# Patient Record
Sex: Male | Born: 1985 | Race: White | Hispanic: No | Marital: Single | Smoking: Current some day smoker
Health system: Southern US, Community
[De-identification: ages and names within clinical notes are randomized; demographics above are authoritative.]

## PROBLEM LIST (undated history)

## (undated) DIAGNOSIS — F988 Other specified behavioral and emotional disorders with onset usually occurring in childhood and adolescence: Secondary | ICD-10-CM

## (undated) DIAGNOSIS — D689 Coagulation defect, unspecified: Secondary | ICD-10-CM

## (undated) DIAGNOSIS — Z87820 Personal history of traumatic brain injury: Secondary | ICD-10-CM

## (undated) DIAGNOSIS — S069X9A Unspecified intracranial injury with loss of consciousness of unspecified duration, initial encounter: Secondary | ICD-10-CM

## (undated) DIAGNOSIS — F909 Attention-deficit hyperactivity disorder, unspecified type: Secondary | ICD-10-CM

## (undated) HISTORY — DX: Other specified behavioral and emotional disorders with onset usually occurring in childhood and adolescence: F98.8

## (undated) HISTORY — PX: OTHER SURGICAL HISTORY: SHX169

## (undated) HISTORY — DX: Unspecified intracranial injury with loss of consciousness of unspecified duration, initial encounter: S06.9X9A

## (undated) HISTORY — DX: Personal history of traumatic brain injury: Z87.820

## (undated) HISTORY — DX: Attention-deficit hyperactivity disorder, unspecified type: F90.9

## (undated) HISTORY — DX: Coagulation defect, unspecified: D68.9

---
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
## 2003-03-28 ENCOUNTER — Encounter
Admission: RE | Admit: 2003-03-28 | Discharge: 2003-06-26 | Payer: Self-pay | Admitting: Physical Medicine and Rehabilitation

## 2003-06-27 ENCOUNTER — Encounter
Admission: RE | Admit: 2003-06-27 | Discharge: 2003-09-25 | Payer: Self-pay | Admitting: Physical Medicine and Rehabilitation

## 2003-09-26 ENCOUNTER — Encounter
Admission: RE | Admit: 2003-09-26 | Discharge: 2003-12-25 | Payer: Self-pay | Admitting: Physical Medicine and Rehabilitation

## 2003-11-08 ENCOUNTER — Encounter: Admission: RE | Admit: 2003-11-08 | Discharge: 2003-11-08 | Payer: Self-pay | Admitting: Psychology

## 2003-12-26 ENCOUNTER — Encounter
Admission: RE | Admit: 2003-12-26 | Discharge: 2004-03-25 | Payer: Self-pay | Admitting: Physical Medicine and Rehabilitation

## 2004-03-11 ENCOUNTER — Encounter
Admission: RE | Admit: 2004-03-11 | Discharge: 2004-06-09 | Payer: Self-pay | Admitting: Physical Medicine and Rehabilitation

## 2004-07-16 ENCOUNTER — Encounter
Admission: RE | Admit: 2004-07-16 | Discharge: 2004-10-14 | Payer: Self-pay | Admitting: Physical Medicine and Rehabilitation

## 2005-06-13 ENCOUNTER — Emergency Department (HOSPITAL_COMMUNITY): Admission: EM | Admit: 2005-06-13 | Discharge: 2005-06-13 | Payer: Self-pay | Admitting: Emergency Medicine

## 2005-09-14 ENCOUNTER — Encounter
Admission: RE | Admit: 2005-09-14 | Discharge: 2005-12-13 | Payer: Self-pay | Admitting: Physical Medicine and Rehabilitation

## 2006-02-09 DIAGNOSIS — D689 Coagulation defect, unspecified: Secondary | ICD-10-CM

## 2006-02-09 HISTORY — DX: Coagulation defect, unspecified: D68.9

## 2006-05-14 ENCOUNTER — Ambulatory Visit: Payer: Self-pay | Admitting: Vascular Surgery

## 2006-05-16 ENCOUNTER — Encounter: Payer: Self-pay | Admitting: Vascular Surgery

## 2006-05-16 ENCOUNTER — Inpatient Hospital Stay (HOSPITAL_COMMUNITY): Admission: EM | Admit: 2006-05-16 | Discharge: 2006-05-21 | Payer: Self-pay | Admitting: Emergency Medicine

## 2006-05-16 ENCOUNTER — Ambulatory Visit: Payer: Self-pay | Admitting: Vascular Surgery

## 2008-02-10 HISTORY — PX: FOOT SURGERY: SHX648

## 2008-05-02 ENCOUNTER — Encounter: Admission: RE | Admit: 2008-05-02 | Discharge: 2008-05-03 | Payer: Self-pay | Admitting: Orthopedic Surgery

## 2008-05-25 ENCOUNTER — Ambulatory Visit: Payer: Self-pay | Admitting: Family Medicine

## 2008-05-25 DIAGNOSIS — F988 Other specified behavioral and emotional disorders with onset usually occurring in childhood and adolescence: Secondary | ICD-10-CM | POA: Insufficient documentation

## 2008-05-25 HISTORY — DX: Other specified behavioral and emotional disorders with onset usually occurring in childhood and adolescence: F98.8

## 2008-08-28 ENCOUNTER — Telehealth: Payer: Self-pay | Admitting: Family Medicine

## 2008-11-21 ENCOUNTER — Telehealth: Payer: Self-pay | Admitting: Family Medicine

## 2008-12-03 ENCOUNTER — Ambulatory Visit: Payer: Self-pay | Admitting: Family Medicine

## 2008-12-03 DIAGNOSIS — R635 Abnormal weight gain: Secondary | ICD-10-CM | POA: Insufficient documentation

## 2009-03-06 ENCOUNTER — Telehealth: Payer: Self-pay | Admitting: Family Medicine

## 2009-05-15 ENCOUNTER — Ambulatory Visit: Payer: Self-pay | Admitting: Family Medicine

## 2009-05-15 DIAGNOSIS — Z87820 Personal history of traumatic brain injury: Secondary | ICD-10-CM

## 2009-05-15 HISTORY — DX: Personal history of traumatic brain injury: Z87.820

## 2009-05-27 ENCOUNTER — Telehealth: Payer: Self-pay | Admitting: Family Medicine

## 2009-06-04 ENCOUNTER — Telehealth: Payer: Self-pay | Admitting: Family Medicine

## 2009-09-02 ENCOUNTER — Telehealth: Payer: Self-pay | Admitting: Family Medicine

## 2009-12-02 ENCOUNTER — Telehealth: Payer: Self-pay | Admitting: Family Medicine

## 2010-03-03 ENCOUNTER — Telehealth: Payer: Self-pay | Admitting: Family Medicine

## 2010-03-11 NOTE — Progress Notes (Signed)
Summary: Ritalin refill X 3 months  Phone Note Call from Patient   Caller: Dad-tom Call For: [phone] Summary of Call: dad is calling requesting pt ritalin la 30  mg xr please call when ready Initial call taken by: Heron Sabins,  March 06, 2009 11:29 AM  Follow-up for Phone Call        written. Follow-up by: Evelena Peat MD,  March 06, 2009 12:19 PM  Additional Follow-up for Phone Call Additional follow up Details #1::        Father informed Rx ready for pick-up Additional Follow-up by: Sid Falcon LPN,  March 06, 2009 1:33 PM    Prescriptions: RITALIN LA 30 MG XR24H-CAP (METHYLPHENIDATE HCL) once daily May fill in two months  #30 x 0   Entered and Authorized by:   Evelena Peat MD   Signed by:   Evelena Peat MD on 03/06/2009   Method used:   Print then Give to Patient   RxID:   [id number] RITALIN LA 30 MG XR24H-CAP (METHYLPHENIDATE HCL) once daily May fill in one month  #30 x 0   Entered and Authorized by:   Evelena Peat MD   Signed by:   Evelena Peat MD on 03/06/2009   Method used:   Print then Give to Patient   RxID:   [id number] RITALIN LA 30 MG XR24H-CAP (METHYLPHENIDATE HCL) once daily  #30 x 0   Entered and Authorized by:   Evelena Peat MD   Signed by:   Evelena Peat MD on 03/06/2009   Method used:   Print then Give to Patient   RxID:   [id number]

## 2010-03-11 NOTE — Progress Notes (Signed)
Summary: Pt req script for Ritalin 30mg  xr  Phone Note Refill Request Call back at Home Phone [phone] Message from:  Patient on September 02, 2009 1:26 PM  Refills Requested: Medication #1:  RITALIN LA 30 MG XR24H-CAP once daily   Dosage confirmed as above?Dosage Confirmed  Medication #2:  RITALIN LA 30 MG XR24H-CAP once daily May fill in one month  Medication #3:  RITALIN LA 30 MG XR24H-CAP once daily May fill in two months. Last Filled: 06/05/09   Last office visit 05/16/09   Method Requested: Pick up at Office Initial call taken by: Lucy Antigua,  September 02, 2009 1:26 PM  Follow-up for Phone Call        Last filled 4/16 [facility] LPN  September 02, 2009 4:34 PM will refill. Follow-up by: Evelena Peat MD,  September 02, 2009 6:02 PM  Additional Follow-up for Phone Call Additional follow up Details #1::        Pt informed on VM ready for pick-up Additional Follow-up by: Sid Falcon LPN,  September 03, 2009 8:25 AM    Prescriptions: RITALIN LA 30 MG XR24H-CAP (METHYLPHENIDATE HCL) once daily May fill in two months  #30 x 0   Entered and Authorized by:   Evelena Peat MD   Signed by:   Evelena Peat MD on 09/02/2009   Method used:   Print then Give to Patient   RxID:   [id number] RITALIN LA 30 MG XR24H-CAP (METHYLPHENIDATE HCL) once daily May fill in one month  #30 x 0   Entered and Authorized by:   Evelena Peat MD   Signed by:   Evelena Peat MD on 09/02/2009   Method used:   Print then Give to Patient   RxID:   [id number] RITALIN LA 30 MG XR24H-CAP (METHYLPHENIDATE HCL) once daily  #30 x 0   Entered and Authorized by:   Evelena Peat MD   Signed by:   Evelena Peat MD on 09/02/2009   Method used:   Print then Give to Patient   RxID:   [id number]

## 2010-03-11 NOTE — Progress Notes (Signed)
Summary: Pt req script for Ritalin LA 30mg   Phone Note Call from Patient Call back at [facility] Phone [phone]   Caller: Patient Summary of Call: Pt called req script Ritalin LA 30mg . Pls call when ready.  Initial call taken by: Lucy Antigua,  June 04, 2009 1:16 PM  Follow-up for Phone Call        refilled Follow-up by: Evelena Peat MD,  June 05, 2009 7:43 AM    Prescriptions: RITALIN LA 30 MG XR24H-CAP (METHYLPHENIDATE HCL) once daily May fill in two months  #30 x 0   Entered and Authorized by:   Evelena Peat MD   Signed by:   Evelena Peat MD on 06/05/2009   Method used:   Print then Give to Patient   RxID:   [id number] RITALIN LA 30 MG XR24H-CAP (METHYLPHENIDATE HCL) once daily May fill in one month  #30 x 0   Entered and Authorized by:   Evelena Peat MD   Signed by:   Evelena Peat MD on 06/05/2009   Method used:   Print then Give to Patient   RxID:   [id number] RITALIN LA 30 MG XR24H-CAP (METHYLPHENIDATE HCL) once daily  #30 x 0   Entered and Authorized by:   Evelena Peat MD   Signed by:   Evelena Peat MD on 06/05/2009   Method used:   Print then Give to Patient   RxID:   [id number]  Pt notified.

## 2010-03-11 NOTE — Progress Notes (Signed)
Summary: Pt req script for Ritalin LA 30mg , last filled 7/25  Phone Note Refill Request Call back at [phone] cell Message from:  Patient on December 02, 2009 11:12 AM  Refills Requested: Medication #1:  RITALIN LA 30 MG XR24H-CAP once daily  Medication #2:  RITALIN LA 30 MG XR24H-CAP once daily May fill in one month  Medication #3:  RITALIN LA 30 MG XR24H-CAP once daily May fill in two months.  Method Requested: Pick up at Office Initial call taken by: Lucy Antigua,  December 02, 2009 11:12 AM  Follow-up for Phone Call        Last filled 7/25 Sid Falcon LPN  December 02, 2009 12:09 PM will refill Follow-up by: Evelena Peat MD,  December 03, 2009 8:39 AM  Additional Follow-up for Phone Call Additional follow up Details #1::        Pt informed ready for pick-up Additional Follow-up by: Sid Falcon LPN,  December 03, 2009 9:33 AM    Prescriptions: RITALIN LA 30 MG XR24H-CAP (METHYLPHENIDATE HCL) once daily May fill in two months  #30 x 0   Entered and Authorized by:   Evelena Peat MD   Signed by:   Evelena Peat MD on 12/03/2009   Method used:   Print then Give to Patient   RxID:   [id number] RITALIN LA 30 MG XR24H-CAP (METHYLPHENIDATE HCL) once daily May fill in one month  #30 x 0   Entered and Authorized by:   Evelena Peat MD   Signed by:   Evelena Peat MD on 12/03/2009   Method used:   Print then Give to Patient   RxID:   [id number] RITALIN LA 30 MG XR24H-CAP (METHYLPHENIDATE HCL) once daily  #30 x 0   Entered and Authorized by:   Evelena Peat MD   Signed by:   Evelena Peat MD on 12/03/2009   Method used:   Print then Give to Patient   RxID:   [id number]

## 2010-03-11 NOTE — Letter (Signed)
Summary: Medical Examination for [facility]  Medical Examination for DMV   Imported By: Maryln Gottron 05/17/2009 12:23:29  _____________________________________________________________________  External Attachment:    Type:   Image     Comment:   External Document

## 2010-03-11 NOTE — Progress Notes (Signed)
Summary: forms mailed?  Phone Note Call from Patient   Caller: mother,[phone] Summary of Call: Came in 4-6 for forms to be mailed to [facility].  Checking to be sure they were mailed. Initial call taken by: Rudy Jew, RN,  May 27, 2009 3:51 PM  Follow-up for Phone Call        YES.  We also made copies to prove that they were done. Follow-up by: Evelena Peat MD,  May 27, 2009 4:01 PM  Additional Follow-up for Phone Call Additional follow up Details #1::        Phone Call Completed Additional Follow-up by: Rudy Jew, RN,  May 27, 2009 4:24 PM
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
## 2010-03-13 NOTE — Progress Notes (Signed)
Summary: refill Ritalin, last filled 12/03/09  Phone Note Refill Request Call back at [phone] Message from:  Patient---live call  Refills Requested: Medication #1:  RITALIN LA 30 MG XR24H-CAP once daily call pt when ready.  Initial call taken by: Warnell Forester,  March 03, 2010 2:17 PM  Follow-up for Phone Call        Last filled 12/03/2009 Sid Falcon LPN  March 03, 2010 2:23 PM refill Schedule office follow up. Follow-up by: Evelena Peat MD,  March 05, 2010 8:26 AM  Additional Follow-up for Phone Call Additional follow up Details #1::        Pt informed he needs follow-up visit, Rx ready for pick-up Additional Follow-up by: Sid Falcon LPN,  March 05, 2010 2:09 PM    Prescriptions: RITALIN LA 30 MG XR24H-CAP (METHYLPHENIDATE HCL) once daily May fill in two months  #30 x 0   Entered by:   Sid Falcon LPN   Authorized by:   Evelena Peat MD   Signed by:   Sid Falcon LPN on 11/91/4782   Method used:   Print then Give to Patient   RxID:   [id number] RITALIN LA 30 MG XR24H-CAP (METHYLPHENIDATE HCL) once daily May fill in one month  #30 x 0   Entered by:   Sid Falcon LPN   Authorized by:   Evelena Peat MD   Signed by:   Sid Falcon LPN on 29/52/8413   Method used:   Print then Give to Patient   RxID:   [id number] RITALIN LA 30 MG XR24H-CAP (METHYLPHENIDATE HCL) once daily  #30 x 0   Entered by:   Sid Falcon LPN   Authorized by:   Evelena Peat MD   Signed by:   Sid Falcon LPN on 34/74/2595   Method used:   Print then Give to Patient   RxID:   [id number] RITALIN LA 30 MG XR24H-CAP (METHYLPHENIDATE HCL) once daily May fill in two months  #30 x 0   Entered and Authorized by:   Evelena Peat MD   Signed by:   Evelena Peat MD on 03/05/2010   Method used:   Print then Give to Patient   RxID:   [id number] RITALIN LA 30 MG XR24H-CAP (METHYLPHENIDATE HCL) once daily May fill in one month  #30 x 0   Entered  and Authorized by:   Evelena Peat MD   Signed by:   Evelena Peat MD on 03/05/2010   Method used:   Print then Give to Patient   RxID:   [id number] RITALIN LA 30 MG XR24H-CAP (METHYLPHENIDATE HCL) once daily  #30 x 0   Entered and Authorized by:   Evelena Peat MD   Signed by:   Evelena Peat MD on 03/05/2010   Method used:   Print then Give to Patient   RxID:   [id number]

## 2010-03-25 ENCOUNTER — Encounter: Payer: Self-pay | Admitting: Family Medicine

## 2010-03-27 ENCOUNTER — Ambulatory Visit: Payer: Self-pay | Admitting: Family Medicine

## 2010-03-28 ENCOUNTER — Ambulatory Visit (INDEPENDENT_AMBULATORY_CARE_PROVIDER_SITE_OTHER): Payer: BC Managed Care – PPO | Admitting: Family Medicine

## 2010-03-28 ENCOUNTER — Encounter: Payer: Self-pay | Admitting: Family Medicine

## 2010-03-28 VITALS — BP 110/72 | Temp 98.6°F | Ht 67.0 in | Wt 219.0 lb

## 2010-03-28 DIAGNOSIS — F988 Other specified behavioral and emotional disorders with onset usually occurring in childhood and adolescence: Secondary | ICD-10-CM

## 2010-03-28 NOTE — Progress Notes (Signed)
  Subjective:    Patient ID: Joseph Pittman, male    DOB: 1986-02-03, 25 y.o.   MRN: [id number]  HPI  patient is seen for followup attention deficit disorder. He has history of traumatic brain injury. Not doing any regular exercise. Remains on Ritalin long acting 30 mg daily. No headaches or other side effects. Recent refills given. Patient has had some weight gain since last year. Approximately 15 pounds. Able to focus with medication.   Review of Systems  denies any headache, appetite change, chest pain, dizziness, or palpitations. Positive for weight gain    Objective:   Physical Exam  patient is alert and in no distress.   oropharynx is clear Eardrums normal Neck supple no mass Chest good auscultation Heart regular rhythm and rate  left hemiparesis from previous traumatic brain injury       Assessment & Plan:   #1 attention deficit disorder  #2 history of traumatic brain injury #3 obesity with recent weight gain      recommend patient work on weight loss. Recommend complete physical later this year.

## 2010-03-28 NOTE — Patient Instructions (Addendum)
Work on weight loss. More consistent exercise. Consider physical at some point later this year.

## 2010-05-12 ENCOUNTER — Encounter: Payer: Self-pay | Admitting: Family Medicine

## 2010-05-12 ENCOUNTER — Ambulatory Visit (INDEPENDENT_AMBULATORY_CARE_PROVIDER_SITE_OTHER): Payer: BC Managed Care – PPO | Admitting: Family Medicine

## 2010-05-12 VITALS — BP 120/82 | Temp 98.4°F

## 2010-05-12 DIAGNOSIS — J309 Allergic rhinitis, unspecified: Secondary | ICD-10-CM

## 2010-05-12 DIAGNOSIS — J040 Acute laryngitis: Secondary | ICD-10-CM

## 2010-05-12 MED ORDER — FLUTICASONE PROPIONATE 50 MCG/ACT NA SUSP: 2.0000 | Freq: Every day | NASAL | Status: AC

## 2010-05-12 NOTE — Patient Instructions (Signed)
Try over the counter Allegra one daily for allergy symptoms.

## 2010-05-12 NOTE — Progress Notes (Signed)
  Subjective:    Patient ID: Joseph Pittman, male    DOB: 1985-03-24, 25 y.o.   MRN: [id number]  HPI Patient seen with laryngitis type symptoms. Onset 3-4 days ago. Occasional dry cough. No fever. Postnasal drip symptoms. History of seasonal allergies. Taking Benadryl with minimal relief. No purulent secretions.   Review of Systems  Constitutional: Negative for fever.  HENT: Positive for congestion, rhinorrhea and postnasal drip. Negative for sore throat, neck pain and neck stiffness.   Respiratory: Positive for cough. Negative for shortness of breath.   Cardiovascular: Negative for chest pain.  Skin: Negative for rash.       Objective:   Physical Exam  Constitutional: He appears well-developed and well-nourished.  HENT:  Head: Normocephalic and atraumatic.  Right Ear: External ear normal.  Left Ear: External ear normal.  Mouth/Throat: Oropharynx is clear and moist. No oropharyngeal exudate.  Neck: Neck supple. No thyromegaly present.  Cardiovascular: Normal rate, regular rhythm and normal heart sounds.   Pulmonary/Chest: Effort normal and breath sounds normal. He has no wheezes. He has no rales.  Lymphadenopathy:    He has no cervical adenopathy.          Assessment & Plan:  Acute laryngitis probably related to allergic postnasal drip symptoms. Start Flonase 2 sprays per nostril once daily and Allegra one daily

## 2010-06-04 ENCOUNTER — Telehealth: Payer: Self-pay | Admitting: Family Medicine

## 2010-06-04 MED ORDER — METHYLPHENIDATE HCL ER (LA) 30 MG PO CP24: 30.0000 mg | ORAL_CAPSULE | ORAL | Status: DC

## 2010-06-04 NOTE — Telephone Encounter (Signed)
Pt called req script for Ritalin 30 mg LA. 3 scripts

## 2010-06-04 NOTE — Telephone Encounter (Signed)
Last filled 03/05/10.  I printed, signed, pt informed ready for pick-up

## 2010-06-27 NOTE — Discharge Summary (Signed)
NAME:  Joseph Pittman, Joseph Pittman              ACCOUNT NO.:  [id number]   MEDICAL RECORD NO.:  [id number]          PATIENT TYPE:  INP   LOCATION:  5730                         FACILITY:  [facility]   PHYSICIAN:  Mobolaji B. Bakare, M.D.DATE OF BIRTH:  Sep 03, 1985   DATE OF ADMISSION:  05/16/2006  DATE OF DISCHARGE:  05/21/2006                               DISCHARGE SUMMARY   PRIMARY CARE PHYSICIAN:  Dr. Evelena Peat.   FINAL DIAGNOSIS:  Right lower extremity deep venous thrombosis.   SECONDARY DIAGNOSIS:  History of head trauma.   PROCEDURES:  1. Chest x-ray shows low lung volumes with bibasilar atelectasis.  2. Lower extremity Doppler showed extensive occlusive deep venous      thrombosis beginning in the posterior tibial vein and crossing      through the popliteus, femoral, profunda and common femoral vein of      the right leg.  There is no evidence of supraventricular      tachycardia or Baker's cyst.  There is no propagation of deep      venous thrombosis into left lower extremity.   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  He was seen by Dr. Caryl Never  in the office and a lower extremity Doppler was done; this was  apparently negative.  However, the patient continued to have pain and  increasing swelling; hence, he was sent to the emergency department.  He  had Doppler repeated in the emergency room which confirmed extensive  DVT, as stated above.  He was started on anticoagulation with Lovenox  and Coumadin.  The patient's INR became therapeutic 2 days prior to  discharge.  Anticoagulation was overlapped with Lovenox for 48 hours.  At the time of discharge, PT and INR were 36.8/2.3.  The right lower  extremity swelling improved a little bit.  This was the major concern of  the family and I have advised them that the swelling would recede over  time.  Pain has since abated.  Should he have pain, he was advised  to  use Tylenol or Vicodin p.r.n.  Given the extent of the DVT without any  outstanding precipitating factor, we obtained hypercoagulation profile  which included antiphospholipid antibody.  Lupus anticoagulant was not  detected.  Cardiolipin antibody was normal.  Phosphatidyl serology was  in normal range.  Prothrombin G 13086 was negative.  Protein C was in  normal range.  Factor V gene mutation was negative.  ANA was negative.  Antithrombin III antibody was normal.  Essentially, there was no genetic  factor detected as an etiology.  The only precipitating factor that can  be considered is sedentary lifestyle.  The patient is not a smoker and  he is not on any form of hormonal therapy.  He should continue Coumadin  for at least 6 months.  There is no family history of DVT or pulmonary  embolism.   DISCHARGE MEDICATIONS:  1. Coumadin 7.5 mg daily, patient to check his PT/INR on May 25, 2006 with Dr. Caryl Never.  2. Vicodin one to two p.r.n. for severe pain.  3. Tylenol p.r.n. for mild to moderate pain.  4. Ritalin 30 mg daily.   FOLLOWUP:  Follow up with Dr. Caryl Never on May 25, 2006.      Mobolaji B. Corky Downs, M.D.  Electronically Signed     MBB/MEDQ  D:  05/27/2006  T:  05/27/2006  Job:  [id number]   cc:   Evelena Peat, M.D.
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
## 2010-08-29 ENCOUNTER — Telehealth: Payer: Self-pay | Admitting: Family Medicine

## 2010-08-29 DIAGNOSIS — R319 Hematuria, unspecified: Secondary | ICD-10-CM

## 2010-08-29 NOTE — Telephone Encounter (Signed)
Last filled 4/25 X 3.  Will wait to fill next week

## 2010-08-29 NOTE — Telephone Encounter (Signed)
Refill  Ritalin

## 2010-09-02 MED ORDER — METHYLPHENIDATE HCL ER (LA) 30 MG PO CP24: 30.0000 mg | ORAL_CAPSULE | ORAL | Status: DC

## 2010-09-02 NOTE — Telephone Encounter (Signed)
Pt informed ready for pick up 

## 2010-09-02 NOTE — Telephone Encounter (Signed)
Pt called again checking on status of Ritalin rx.

## 2010-11-28 ENCOUNTER — Telehealth: Payer: Self-pay | Admitting: Family Medicine

## 2010-11-28 MED ORDER — METHYLPHENIDATE HCL ER (LA) 30 MG PO CP24: 30.0000 mg | ORAL_CAPSULE | ORAL | Status: DC

## 2010-11-28 NOTE — Telephone Encounter (Signed)
Refill Ritalin [place]. Thanks.

## 2010-11-28 NOTE — Telephone Encounter (Signed)
OK to refill

## 2011-02-25 ENCOUNTER — Telehealth: Payer: Self-pay | Admitting: Family Medicine

## 2011-02-25 NOTE — Telephone Encounter (Signed)
Refill Ritalin. Thanks. °

## 2011-02-26 MED ORDER — METHYLPHENIDATE HCL ER (LA) 30 MG PO CP24: 30.0000 mg | ORAL_CAPSULE | ORAL | Status: DC

## 2011-02-26 NOTE — Telephone Encounter (Signed)
Father informed rx ready to pick-up

## 2011-02-26 NOTE — Telephone Encounter (Signed)
Last filled 11/28/10

## 2011-02-26 NOTE — Telephone Encounter (Signed)
Refill OK

## 2011-05-14 ENCOUNTER — Ambulatory Visit (INDEPENDENT_AMBULATORY_CARE_PROVIDER_SITE_OTHER): Payer: BC Managed Care – PPO | Admitting: Family Medicine

## 2011-05-14 ENCOUNTER — Encounter: Payer: Self-pay | Admitting: Family Medicine

## 2011-05-14 VITALS — BP 130/90 | Temp 99.0°F | Wt 210.0 lb

## 2011-05-14 DIAGNOSIS — F988 Other specified behavioral and emotional disorders with onset usually occurring in childhood and adolescence: Secondary | ICD-10-CM

## 2011-05-14 DIAGNOSIS — M25511 Pain in right shoulder: Secondary | ICD-10-CM

## 2011-05-14 DIAGNOSIS — M25519 Pain in unspecified shoulder: Secondary | ICD-10-CM

## 2011-05-14 MED ORDER — NAPROXEN 500 MG PO TABS: 500.0000 mg | ORAL_TABLET | Freq: Two times a day (BID) | ORAL | Status: AC

## 2011-05-14 MED ORDER — METHYLPHENIDATE HCL ER (LA) 30 MG PO CP24: 30.0000 mg | ORAL_CAPSULE | ORAL | Status: DC

## 2011-05-14 NOTE — Progress Notes (Signed)
  Subjective:    Patient ID: Joseph Pittman, male    DOB: [birth year], 26 y.o.   MRN: [id number]  HPI  Acute visit. Right shoulder pain. Larey Seat 05/05/2011. He's not sure exactly how he landed possibly with arm outstretched against the wall as he fell. No bruising or swelling. No neck pain. Pain is moderate in intensity. Some radiation toward deltoid. No numbness. No prior history of shoulder problems. Pain is actually somewhat improved compared to one week ago. He is now able to dress and undress himself without assistance. Has taken only rare Advil which may have helped slightly. Aggravated by abduction.  History of ADD. Requesting refills of Ritalin. ADD stable. No headaches. No medication side effects. He's lost 10 pounds since last visit due to his efforts.   Review of Systems  Constitutional: Negative for fever, chills and unexpected weight change.  Respiratory: Negative for cough and shortness of breath.   Cardiovascular: Negative for chest pain.  Musculoskeletal: Positive for arthralgias.  Neurological: Negative for numbness.       Objective:   Physical Exam  Constitutional: He appears well-developed and well-nourished.  Neck: Neck supple.  Cardiovascular: Normal rate and regular rhythm.   Pulmonary/Chest: Effort normal and breath sounds normal. No respiratory distress. He has no wheezes. He has no rales.  Musculoskeletal:       Right shoulder reveals no ecchymosis. No edema. Fairly good range of motion. Pain with abduction greater than 100. He has some weakness with external rotation and slight weakness with abduction vs limited by pain. No clavicle or other bony tenderness.  Lymphadenopathy:    He has no cervical adenopathy.          Assessment & Plan:  #1 right shoulder pain. Cannot rule out rotator cuff tear but hopefully just a strain. Naproxen 500 mg twice a day with caution for possible side effects. If no significant improvement one to 2 weeks consider MRI scan. We'll  obtain plain x-rays today though low suspicion of fracture. #2 ADD. Stable. Refill Ritalin

## 2011-05-14 NOTE — Patient Instructions (Signed)
Call in 1-2 weeks if shoulder pain no better.

## 2011-05-18 ENCOUNTER — Ambulatory Visit (INDEPENDENT_AMBULATORY_CARE_PROVIDER_SITE_OTHER)
Admission: RE | Admit: 2011-05-18 | Discharge: 2011-05-18 | Disposition: A | Discharge status: Home / Self Care | Payer: BC Managed Care – PPO | Source: Ambulatory Visit | Attending: Family Medicine | Admitting: Family Medicine

## 2011-05-18 DIAGNOSIS — M25519 Pain in unspecified shoulder: Secondary | ICD-10-CM

## 2011-05-18 DIAGNOSIS — M25511 Pain in right shoulder: Secondary | ICD-10-CM

## 2011-05-19 NOTE — Progress Notes (Signed)
Quick Note:  Pt informed ______ 

## 2011-05-25 ENCOUNTER — Telehealth: Payer: Self-pay | Admitting: Family Medicine

## 2011-05-25 DIAGNOSIS — M25511 Pain in right shoulder: Secondary | ICD-10-CM

## 2011-05-25 NOTE — Telephone Encounter (Signed)
Will order.  Let family know.

## 2011-05-25 NOTE — Telephone Encounter (Signed)
Pts father called and said that his son is still having pain, so pt is req to get an MRI scheduled as recommended by Dr Caryl Never.

## 2011-05-28 ENCOUNTER — Ambulatory Visit
Admission: RE | Admit: 2011-05-28 | Discharge: 2011-05-28 | Disposition: A | Discharge status: Home / Self Care | Payer: BC Managed Care – PPO | Source: Ambulatory Visit | Attending: Family Medicine | Admitting: Family Medicine

## 2011-05-28 DIAGNOSIS — M25511 Pain in right shoulder: Secondary | ICD-10-CM

## 2011-06-04 ENCOUNTER — Encounter: Payer: Self-pay | Admitting: Family Medicine

## 2011-06-04 ENCOUNTER — Ambulatory Visit (INDEPENDENT_AMBULATORY_CARE_PROVIDER_SITE_OTHER): Payer: BC Managed Care – PPO | Admitting: Family Medicine

## 2011-06-04 VITALS — BP 130/90 | Temp 98.7°F | Wt 210.0 lb

## 2011-06-04 DIAGNOSIS — M25519 Pain in unspecified shoulder: Secondary | ICD-10-CM

## 2011-06-04 DIAGNOSIS — M25511 Pain in right shoulder: Secondary | ICD-10-CM

## 2011-06-04 NOTE — Patient Instructions (Signed)
We will call you regarding orthopedic referral. 

## 2011-06-04 NOTE — Progress Notes (Signed)
  Subjective:    Patient ID: Joseph Pittman, male    DOB: 1985/03/09, 26 y.o.   MRN: [id number]  HPI  Followup shoulder injury. Larey Seat about one month ago. We obtained MRI scan which showed question of nondisplaced fracture greater tuberosity of humerus. Question of posterior labrum tear. No evidence rotator cuff tear. Possibly some tendinitis changes as well. He is moving shoulder well but pain is essentially not improved.  Taking NSAID with Naproxen. Occasional night pain. Pain with external rotation and abduction. No edema. No definite weakness.  Past Medical History  Diagnosis Date  . ADD 05/25/2008  . Personal history of traumatic brain injury 05/15/2009  . ADD (attention deficit disorder with hyperactivity)   . Brain injury 2003  . ADD (attention deficit disorder)   . 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 Dvt     history of  . Foot surgery 2010    right foot  . Brain surgery 2003    tramatic injury    reports that he has been smoking.  He does not have any smokeless tobacco history on file. He reports that he drinks alcohol. He reports that he does not use illicit drugs. family history includes Diabetes in his father and other; Heart disease in his other; and Hyperlipidemia in his father. No Known Allergies    Review of Systems  Constitutional: Negative for appetite change and unexpected weight change.  Respiratory: Negative for cough and shortness of breath.   Cardiovascular: Negative for chest pain.  Gastrointestinal: Negative for abdominal pain.       Objective:   Physical Exam  Constitutional: He appears well-developed and well-nourished.  Cardiovascular: Regular rhythm.   Pulmonary/Chest: Effort normal and breath sounds normal. No respiratory distress. He has no wheezes. He has no rales.  Musculoskeletal:       Right shoulder reveals pain with abduction and external rotation. He has full range of motion the right shoulder. No  specific point tenderness. No bicipital tenderness. No a.c. joint tenderness.  No significant rotator cuff tenderness.          Assessment & Plan:  Right shoulder pain. Question of labrum tear by MRI. Question of nondisplaced fracture greater tuberosity right humerus.  orthopedic referral

## 2011-07-22 ENCOUNTER — Ambulatory Visit: Payer: BC Managed Care – PPO | Admitting: Family Medicine

## 2011-08-05 ENCOUNTER — Encounter: Payer: Self-pay | Admitting: Family Medicine

## 2011-08-05 ENCOUNTER — Ambulatory Visit: Payer: BC Managed Care – PPO | Admitting: Family Medicine

## 2011-08-05 ENCOUNTER — Ambulatory Visit (INDEPENDENT_AMBULATORY_CARE_PROVIDER_SITE_OTHER): Payer: BC Managed Care – PPO | Admitting: Family Medicine

## 2011-08-05 VITALS — BP 110/72 | Temp 98.1°F | Ht 67.5 in | Wt 218.0 lb

## 2011-08-05 DIAGNOSIS — F988 Other specified behavioral and emotional disorders with onset usually occurring in childhood and adolescence: Secondary | ICD-10-CM

## 2011-08-05 DIAGNOSIS — Z87820 Personal history of traumatic brain injury: Secondary | ICD-10-CM

## 2011-08-05 MED ORDER — METHYLPHENIDATE HCL ER (LA) 30 MG PO CP24: 30.0000 mg | ORAL_CAPSULE | ORAL | Status: DC
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
## 2011-11-19 ENCOUNTER — Telehealth: Payer: Self-pay | Admitting: Family Medicine

## 2011-11-19 MED ORDER — METHYLPHENIDATE HCL ER (LA) 30 MG PO CP24: 30.0000 mg | ORAL_CAPSULE | ORAL | Status: DC

## 2011-11-19 NOTE — Telephone Encounter (Signed)
Father informed ready

## 2011-11-19 NOTE — Telephone Encounter (Signed)
Patient's father called stating that he need a refill of his ritalin. Please assist and call [phone] when ready.

## 2011-11-19 NOTE — Telephone Encounter (Signed)
Refill okay?  

## 2011-11-19 NOTE — Telephone Encounter (Signed)
Ritalin last filled 08/05/11, #30 with 2 refills

## 2012-02-16 ENCOUNTER — Telehealth: Payer: Self-pay | Admitting: Family Medicine

## 2012-02-16 NOTE — Telephone Encounter (Signed)
Patient states that he also would like written rxs for this now.

## 2012-02-16 NOTE — Telephone Encounter (Signed)
Ritalin last filled 11-19-11, [facility] sent pt a letter informing this med will need PA just like they did last January.  We will generate 3 prescriptions on 02/18/12 or 02/19/12 and inform pt they are ready to pick-up.

## 2012-02-16 NOTE — Telephone Encounter (Signed)
Patient called stating that he need a refill of his ritalin LA 30mg  1poqd. Please assist. Patient states he need a prior authorization.

## 2012-02-17 NOTE — Telephone Encounter (Signed)
Prior auth submitted. Will let you know when approved.

## 2012-02-17 NOTE — Telephone Encounter (Signed)
VM left on home VM PA has been submitted, Rx will be done on Friday

## 2012-02-18 NOTE — Telephone Encounter (Signed)
Refill OK

## 2012-02-19 MED ORDER — METHYLPHENIDATE HCL ER (LA) 30 MG PO CP24: 30.0000 mg | ORAL_CAPSULE | ORAL | Status: DC

## 2012-02-19 NOTE — Addendum Note (Signed)
Addended by: Melchor Amour on: 02/19/2012 01:15 PM   Modules accepted: Orders

## 2012-05-19 ENCOUNTER — Telehealth: Payer: Self-pay | Admitting: Family Medicine

## 2012-05-19 NOTE — Telephone Encounter (Signed)
PT calling to request refill of his methylphenidate (RITALIN LA) 30 MG 24 hr capsule, that he takes once a day. Please assist.

## 2012-05-19 NOTE — Telephone Encounter (Signed)
Refill OK

## 2012-05-19 NOTE — Telephone Encounter (Signed)
Ritalin last filled 02-28-2012, #30 with 2 refills

## 2012-05-20 MED ORDER — METHYLPHENIDATE HCL ER (LA) 30 MG PO CP24: 30.0000 mg | ORAL_CAPSULE | ORAL | Status: DC

## 2012-05-20 NOTE — Addendum Note (Signed)
Addended by: Melchor Amour on: 05/20/2012 12:14 PM   Modules accepted: Orders

## 2012-05-20 NOTE — Telephone Encounter (Signed)
Pt father informed

## 2012-08-18 ENCOUNTER — Telehealth: Payer: Self-pay | Admitting: Family Medicine

## 2012-08-18 NOTE — Telephone Encounter (Signed)
Needs office follow up.  Not seen in > one year. 

## 2012-08-18 NOTE — Telephone Encounter (Signed)
PT called to request 3 month supply of his methylphenidate (RITALIN LA) 30 MG 24 hr capsule. Please assist.

## 2012-08-18 NOTE — Telephone Encounter (Signed)
Patient was last seen on 07/2011

## 2012-08-19 NOTE — Telephone Encounter (Signed)
Informed patient he needs a office visit before he can get any refills

## 2012-08-25 ENCOUNTER — Ambulatory Visit (INDEPENDENT_AMBULATORY_CARE_PROVIDER_SITE_OTHER): Payer: BC Managed Care – PPO | Admitting: Family Medicine

## 2012-08-25 VITALS — BP 120/82 | HR 86 | Temp 98.4°F | Wt 217.0 lb

## 2012-08-25 DIAGNOSIS — F988 Other specified behavioral and emotional disorders with onset usually occurring in childhood and adolescence: Secondary | ICD-10-CM

## 2012-08-25 MED ORDER — METHYLPHENIDATE HCL ER (LA) 30 MG PO CP24: 30.0000 mg | ORAL_CAPSULE | ORAL | Status: DC
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
## 2012-08-25 NOTE — Patient Instructions (Addendum)
Consider complete physical at some point later this year. 

## 2012-11-16 ENCOUNTER — Encounter: Payer: Self-pay | Admitting: Family Medicine

## 2012-11-16 ENCOUNTER — Ambulatory Visit (INDEPENDENT_AMBULATORY_CARE_PROVIDER_SITE_OTHER): Payer: BC Managed Care – PPO | Admitting: Family Medicine

## 2012-11-16 ENCOUNTER — Other Ambulatory Visit: Payer: Self-pay

## 2012-11-16 VITALS — BP 126/90 | HR 88 | Temp 98.1°F | Wt 215.0 lb

## 2012-11-16 DIAGNOSIS — Z87820 Personal history of traumatic brain injury: Secondary | ICD-10-CM

## 2012-11-16 DIAGNOSIS — F988 Other specified behavioral and emotional disorders with onset usually occurring in childhood and adolescence: Secondary | ICD-10-CM

## 2012-11-16 MED ORDER — METHYLPHENIDATE HCL ER (LA) 30 MG PO CP24: 30.0000 mg | ORAL_CAPSULE | ORAL | Status: DC
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
## 2013-02-17 ENCOUNTER — Telehealth: Payer: Self-pay | Admitting: Family Medicine

## 2013-02-17 NOTE — Telephone Encounter (Signed)
Last visit 11/16/12 Last refill 11/16/12 #30 2 refills

## 2013-02-17 NOTE — Telephone Encounter (Signed)
Pt needs new rx ritalin la 30 mg °

## 2013-02-20 MED ORDER — METHYLPHENIDATE HCL ER (LA) 30 MG PO CP24: 30.0000 mg | ORAL_CAPSULE | ORAL | Status: DC

## 2013-02-20 NOTE — Telephone Encounter (Signed)
Refill OK

## 2013-02-20 NOTE — Telephone Encounter (Signed)
Pt aware that RX is ready for pick up  

## 2013-03-15 ENCOUNTER — Ambulatory Visit (INDEPENDENT_AMBULATORY_CARE_PROVIDER_SITE_OTHER): Payer: BC Managed Care – PPO | Admitting: Family Medicine

## 2013-03-15 ENCOUNTER — Encounter: Payer: Self-pay | Admitting: Family Medicine

## 2013-03-15 VITALS — BP 120/70 | HR 81 | Temp 97.6°F | Wt 219.0 lb

## 2013-03-15 DIAGNOSIS — H6690 Otitis media, unspecified, unspecified ear: Secondary | ICD-10-CM

## 2013-03-15 DIAGNOSIS — H6691 Otitis media, unspecified, right ear: Secondary | ICD-10-CM

## 2013-03-15 MED ORDER — AMOXICILLIN 875 MG PO TABS: 875.0000 mg | ORAL_TABLET | Freq: Two times a day (BID) | ORAL | Status: DC

## 2013-03-15 NOTE — Progress Notes (Signed)
   Subjective:    Patient ID: Joseph Pittman, male    DOB: 01-24-1986, 28 y.o.   MRN: [id number]  HPI Acute visit Patient is seen with some fullness and ringing in the right ear. He describes onset last weekend of fever of 102 along with some nasal congestion and cough. No hearing changes. His father irrigated his left ear and removed some cerumen but no cerumen was removed from the right ear. He is describing a" echo" sound in the right ear. His cough is relatively mild. Has not had any recurrent fever since Sunday. Denies sore throat.  Past Medical History  Diagnosis Date  . ADD 05/25/2008  . Personal history of traumatic brain injury 05/15/2009  . ADD (attention deficit disorder with hyperactivity)   . Brain injury 2003  . ADD (attention deficit disorder)   . 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 Dvt      history of  . Foot surgery  2010    right foot  . Brain surgery  2003    tramatic injury    reports that he has been smoking.  He does not have any smokeless tobacco history on file. He reports that he drinks alcohol. He reports that he does not use illicit drugs. family history includes Diabetes in his father and other; Heart disease in his other; Hyperlipidemia in his father. No Known Allergies    Review of Systems  Constitutional: Negative for fever and chills.  HENT: Positive for congestion. Negative for ear discharge, ear pain and hearing loss.   Respiratory: Positive for cough.        Objective:   Physical Exam  Constitutional: He appears well-developed and well-nourished.  HENT:  Mouth/Throat: Oropharynx is clear and moist.  Left eardrum reveals mild erythema along the inferior portion otherwise normal. Right eardrum is retracted and very erythematous. No obvious perforation.  Cardiovascular: Normal rate.   Pulmonary/Chest: Effort normal and breath sounds normal. No respiratory distress. He has no wheezes. He has no  rales.          Assessment & Plan:  Acute right otitis media. Amoxicillin 875 mg twice daily for 10 days followup when necessary

## 2013-03-15 NOTE — Patient Instructions (Signed)
Otitis Media, Adult Otitis media is redness, soreness, and swelling (inflammation) of the middle ear. Otitis media may be caused by allergies or, most commonly, by infection. Often it occurs as a complication of the common cold. SIGNS AND SYMPTOMS Symptoms of otitis media may include:  Earache.  Fever.  Ringing in your ear.  Headache.  Leakage of fluid from the ear. DIAGNOSIS To diagnose otitis media, your health care provider will examine your ear with an otoscope. This is an instrument that allows your health care provider to see into your ear in order to examine your eardrum. Your health care provider also will ask you questions about your symptoms. TREATMENT  Typically, otitis media resolves on its own within 3 5 days. Your health care provider may prescribe medicine to ease your symptoms of pain. If otitis media does not resolve within 5 days or is recurrent, your health care provider may prescribe antibiotic medicines if he or she suspects that a bacterial infection is the cause. HOME CARE INSTRUCTIONS   Take your medicine as directed until it is gone, even if you feel better after the first few days.  Only take over-the-counter or prescription medicines for pain, discomfort, or fever as directed by your health care provider.  Follow up with your health care provider as directed. SEEK MEDICAL CARE IF:  You have otitis media only in one ear or bleeding from your nose or both.  You notice a lump on your neck.  You are not getting better in 3 5 days.  You feel worse instead of better. SEEK IMMEDIATE MEDICAL CARE IF:   You have pain that is not controlled with medicine.  You have swelling, redness, or pain around your ear or stiffness in your neck.  You notice that part of your face is paralyzed.  You notice that the bone behind your ear (mastoid) is tender when you touch it. MAKE SURE YOU:   Understand these instructions.  Will watch your condition.  Will get help  right away if you are not doing well or get worse. Document Released: 11/01/2003 Document Revised: 11/16/2012 Document Reviewed: 08/23/2012 [employer] Patient Information 2014 [employer].  

## 2013-03-15 NOTE — Progress Notes (Signed)
Pre visit review using our clinic review tool, if applicable. No additional management support is needed unless otherwise documented below in the visit note. 

## 2013-03-17 ENCOUNTER — Telehealth: Payer: Self-pay | Admitting: Family Medicine

## 2013-03-17 NOTE — Telephone Encounter (Signed)
Relevant patient education mailed to patient.  

## 2013-05-16 ENCOUNTER — Telehealth: Payer: Self-pay | Admitting: Family Medicine

## 2013-05-16 NOTE — Telephone Encounter (Signed)
Pt request refill of methylphenidate (RITALIN LA) 30 MG 24 hr capsule 3 mo supply

## 2013-05-16 NOTE — Telephone Encounter (Signed)
Last visit 03/15/13 Last refill 02/20/13 #30 2 refills

## 2013-05-17 MED ORDER — METHYLPHENIDATE HCL ER (LA) 30 MG PO CP24: 30.0000 mg | ORAL_CAPSULE | ORAL | Status: DC

## 2013-05-17 NOTE — Telephone Encounter (Signed)
Refills OK. 

## 2013-05-17 NOTE — Telephone Encounter (Signed)
Left message that RX is ready for pick up.

## 2013-08-14 ENCOUNTER — Telehealth: Payer: Self-pay | Admitting: Family Medicine

## 2013-08-14 NOTE — Telephone Encounter (Signed)
Last visit 03/15/13 Last refill 05/17/13 #30 2 refill

## 2013-08-14 NOTE — Telephone Encounter (Signed)
Refill ok? 

## 2013-08-14 NOTE — Telephone Encounter (Signed)
Pt req rx on methylphenidate (RITALIN LA) 30 MG 24 hr capsule

## 2013-08-15 MED ORDER — METHYLPHENIDATE HCL ER (LA) 30 MG PO CP24: 30.0000 mg | ORAL_CAPSULE | ORAL | Status: DC

## 2013-08-15 NOTE — Telephone Encounter (Signed)
Left message on [facility] that RX is ready for pickup 

## 2013-10-16 ENCOUNTER — Emergency Department (HOSPITAL_COMMUNITY): Payer: BC Managed Care – PPO

## 2013-10-16 ENCOUNTER — Emergency Department (HOSPITAL_COMMUNITY)
Admission: EM | Admit: 2013-10-16 | Discharge: 2013-10-16 | Disposition: A | Discharge status: Home / Self Care | Payer: BC Managed Care – PPO | Attending: Emergency Medicine | Admitting: Emergency Medicine

## 2013-10-16 ENCOUNTER — Encounter (HOSPITAL_COMMUNITY): Payer: Self-pay | Admitting: Emergency Medicine

## 2013-10-16 DIAGNOSIS — Z87820 Personal history of traumatic brain injury: Secondary | ICD-10-CM | POA: Diagnosis not present

## 2013-10-16 DIAGNOSIS — Z79899 Other long term (current) drug therapy: Secondary | ICD-10-CM | POA: Insufficient documentation

## 2013-10-16 DIAGNOSIS — W230XXA Caught, crushed, jammed, or pinched between moving objects, initial encounter: Secondary | ICD-10-CM | POA: Diagnosis not present

## 2013-10-16 DIAGNOSIS — Z862 Personal history of diseases of the blood and blood-forming organs and certain disorders involving the immune mechanism: Secondary | ICD-10-CM | POA: Diagnosis not present

## 2013-10-16 DIAGNOSIS — Y9389 Activity, other specified: Secondary | ICD-10-CM | POA: Diagnosis not present

## 2013-10-16 DIAGNOSIS — F17200 Nicotine dependence, unspecified, uncomplicated: Secondary | ICD-10-CM | POA: Insufficient documentation

## 2013-10-16 DIAGNOSIS — F988 Other specified behavioral and emotional disorders with onset usually occurring in childhood and adolescence: Secondary | ICD-10-CM | POA: Diagnosis not present

## 2013-10-16 DIAGNOSIS — Y9229 Other specified public building as the place of occurrence of the external cause: Secondary | ICD-10-CM | POA: Diagnosis not present

## 2013-10-16 DIAGNOSIS — S99929A Unspecified injury of unspecified foot, initial encounter: Principal | ICD-10-CM

## 2013-10-16 DIAGNOSIS — Z9889 Other specified postprocedural states: Secondary | ICD-10-CM | POA: Diagnosis not present

## 2013-10-16 DIAGNOSIS — S8990XA Unspecified injury of unspecified lower leg, initial encounter: Secondary | ICD-10-CM | POA: Insufficient documentation

## 2013-10-16 DIAGNOSIS — S99919A Unspecified injury of unspecified ankle, initial encounter: Principal | ICD-10-CM

## 2013-10-16 DIAGNOSIS — M79672 Pain in left foot: Secondary | ICD-10-CM

## 2013-10-16 MED ORDER — ONDANSETRON HCL 4 MG PO TABS: 4.0000 mg | ORAL_TABLET | Freq: Four times a day (QID) | ORAL | Status: DC

## 2013-10-16 MED ORDER — HYDROCODONE-ACETAMINOPHEN 5-325 MG PO TABS
2.0000 | ORAL_TABLET | Freq: Once | ORAL | Status: AC
  Administered 2013-10-16: 2 via ORAL
  Filled 2013-10-16: qty 2

## 2013-10-16 MED ORDER — HYDROCODONE-ACETAMINOPHEN 5-325 MG PO TABS: 1.0000 | ORAL_TABLET | Freq: Four times a day (QID) | ORAL | Status: DC | PRN

## 2013-10-16 MED ORDER — ONDANSETRON 8 MG PO TBDP
8.0000 mg | ORAL_TABLET | Freq: Once | ORAL | Status: AC
  Administered 2013-10-16: 8 mg via ORAL
  Filled 2013-10-16: qty 1

## 2013-10-16 NOTE — ED Provider Notes (Signed)
CSN: [id number]     Arrival date & time 10/16/13  7829 History   First MD Initiated Contact with Patient 10/16/13 [phone]     Chief Complaint  Patient presents with  . Foot Pain     (Consider location/radiation/quality/duration/timing/severity/associated sxs/prior Treatment) HPI Comments: The patient is a 28 year old male with history of traumatic brain injury and ADD who presents to the emergency department today for evaluation of left foot pain. He reports that last night he was in a restaurant and caught his foot on a chair. He has had pain since this time. He describes his pain as sore and sharp. Pain is worse when he tries to put weight on his foot. He had surgery in 2010 in [place], [place]. He is here today for x-rays of his foot. He denies any other injuries. He did not hit his head during this fall.  The history is provided by the patient and a parent. No language interpreter was used.    Past Medical History  Diagnosis Date  . ADD 05/25/2008  . Personal history of traumatic brain injury 05/15/2009  . ADD (attention deficit disorder with hyperactivity)   . Brain injury 2003  . ADD (attention deficit disorder)   . 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 Dvt      history of  . Foot surgery  2010    right foot  . Brain surgery  2003    tramatic injury   Family History  Problem Relation Age of Onset  . Heart disease Other   . Diabetes Other   . Diabetes Father   . Hyperlipidemia Father    History  Substance Use Topics  . Smoking status: Current Every Day Smoker  . Smokeless tobacco: Not on file  . Alcohol Use: Yes    Review of Systems  Constitutional: Negative for fever and chills.  Respiratory: Negative for shortness of breath.   Cardiovascular: Negative for chest pain.  Gastrointestinal: Negative for nausea and vomiting.  Musculoskeletal: Positive for joint swelling and myalgias.  All other systems reviewed and  are negative.     Allergies  Review of patient's allergies indicates no known allergies.  Home Medications   Prior to Admission medications   Medication Sig Start Date End Date Taking? Authorizing Provider  fish oil-omega-3 fatty acids 1000 MG capsule Take 2 g by mouth daily.    Yes Historical Provider, MD  methylphenidate (RITALIN LA) 30 MG 24 hr capsule Take 1 capsule (30 mg total) by mouth every morning. 08/15/13  Yes Kristian Covey, MD  Multiple Vitamins-Minerals (MENS 50+ MULTI VITAMIN/MIN PO) Take by mouth daily.     Yes Historical Provider, MD   BP 139/88  Pulse 99  Temp(Src) 98.3 F (36.8 C) (Oral)  Resp 16  SpO2 97% Physical Exam  Nursing note and vitals reviewed. Constitutional: He is oriented to person, place, and time. He appears well-developed and well-nourished. No distress.  HENT:  Head: Normocephalic and atraumatic.  Right Ear: External ear normal.  Left Ear: External ear normal.  Nose: Nose normal.  Eyes: Conjunctivae are normal.  Neck: Normal range of motion. No tracheal deviation present.  Cardiovascular: Normal rate, regular rhythm, normal heart sounds, intact distal pulses and normal pulses.   Pulses:      Posterior tibial pulses are 2+ on the right side, and 2+ on the left side.  Pulmonary/Chest: Effort normal and breath sounds normal. No stridor.  Abdominal: Soft. He exhibits no distension. There is no tenderness.  Musculoskeletal: Normal range of motion.  Significant swelling to left foot. Compartment soft. Neurovascularly intact.  Tender to palpation diffusely over left foot.  Decreased strength to left extremities - chronic following TBI per patient.  Neurological: He is alert and oriented to person, place, and time.  Skin: Skin is warm and dry. He is not diaphoretic.  Psychiatric: He has a normal mood and affect. His behavior is normal.    ED Course  Procedures (including critical care time) Labs Review Labs Reviewed - No data to  display  Imaging Review Dg Foot Complete Left  10/16/2013   CLINICAL DATA:  Left foot pain and swelling. Foot trauma today, remote history of foot trauma.  EXAM: LEFT FOOT - COMPLETE 3+ VIEW  COMPARISON:  None.  FINDINGS: No acute fracture deformity or dislocation. Status post first phalanx/metatarsal plate and screw fixation with arthrodesis. Suture anchors within the metatarsal heads. Two calcaneal screws are well seated without periprosthetic lucency. Fourth toe hammertoe deformity. No destructive bony lesions. Dorsal foot soft tissue swelling, without subcutaneous gas or radiopaque foreign bodies.  IMPRESSION: Soft tissue swelling without fracture deformity or dislocation.  Postsurgical changes of the left foot without radiographic findings of hardware failure.   Electronically Signed   By: Awilda Metro   On: 10/16/2013 06:10     EKG Interpretation None      MDM   Final diagnoses:  Left foot pain   Patient presents to ED for evaluation of left foot swelling and pain after getting his foot caught in a chair last night. XR is negative for fracture or deformity or dislocation. Neurovascularly intact. Compartment soft. Discussed RICE with patient and parents. Patient will make an appointment with his orthopedic surgeon, he does not want a referral to on call physician in [place]. Discussed reasons to return to ED immediately. Vital signs stable for discharge. Patient / Family / Caregiver informed of clinical course, understand medical decision-making process, and agree with plan.     Mora Bellman, PA-C 10/16/13 [phone]

## 2013-10-16 NOTE — ED Provider Notes (Signed)
Medical screening examination/treatment/procedure(s) were performed by non-physician practitioner and as supervising physician I was immediately available for consultation/collaboration.   EKG Interpretation None       Ethelda Chick, MD 10/16/13 [phone]

## 2013-10-16 NOTE — ED Notes (Signed)
Returned from xray

## 2013-10-16 NOTE — ED Notes (Signed)
Pt arrived to the ED with a complaint of left foot pain.  Pt has had pins in his left toes in 2010.  Pt caught his foot on a chair, and put full pressure on his toes.  Pt foot is swollena nd has hurt since the incident.

## 2013-10-16 NOTE — Discharge Instructions (Signed)

## 2013-10-16 NOTE — ED Notes (Signed)
Patient transported to X-ray 

## 2013-10-16 NOTE — ED Notes (Signed)
PA at bedside.

## 2013-11-17 ENCOUNTER — Telehealth: Payer: Self-pay | Admitting: Family Medicine

## 2013-11-17 NOTE — Telephone Encounter (Signed)
Pt request refill methylphenidate (RITALIN LA) 30 MG 24 hr capsule 90 day supply

## 2013-11-17 NOTE — Telephone Encounter (Signed)
Refill OK

## 2013-11-17 NOTE — Telephone Encounter (Signed)
Last visit 03/15/13 Last refill 08/15/13 #30 0 refill

## 2013-11-20 MED ORDER — METHYLPHENIDATE HCL ER (LA) 30 MG PO CP24: 30.0000 mg | ORAL_CAPSULE | ORAL | Status: DC

## 2013-11-20 NOTE — Telephone Encounter (Signed)
Left message on [facility] that Rx is ready for pickup  

## 2014-01-08 ENCOUNTER — Emergency Department (HOSPITAL_COMMUNITY): Payer: BC Managed Care – PPO

## 2014-01-08 ENCOUNTER — Emergency Department (HOSPITAL_COMMUNITY)
Admission: EM | Admit: 2014-01-08 | Discharge: 2014-01-08 | Disposition: A | Discharge status: Home / Self Care | Payer: BC Managed Care – PPO | Attending: Emergency Medicine | Admitting: Emergency Medicine

## 2014-01-08 ENCOUNTER — Encounter (HOSPITAL_COMMUNITY): Payer: Self-pay | Admitting: Emergency Medicine

## 2014-01-08 DIAGNOSIS — W010XXA Fall on same level from slipping, tripping and stumbling without subsequent striking against object, initial encounter: Secondary | ICD-10-CM | POA: Insufficient documentation

## 2014-01-08 DIAGNOSIS — G8194 Hemiplegia, unspecified affecting left nondominant side: Secondary | ICD-10-CM | POA: Insufficient documentation

## 2014-01-08 DIAGNOSIS — R52 Pain, unspecified: Secondary | ICD-10-CM

## 2014-01-08 DIAGNOSIS — Z720 Tobacco use: Secondary | ICD-10-CM | POA: Insufficient documentation

## 2014-01-08 DIAGNOSIS — Z87820 Personal history of traumatic brain injury: Secondary | ICD-10-CM | POA: Diagnosis not present

## 2014-01-08 DIAGNOSIS — Z79899 Other long term (current) drug therapy: Secondary | ICD-10-CM | POA: Insufficient documentation

## 2014-01-08 DIAGNOSIS — T148XXA Other injury of unspecified body region, initial encounter: Secondary | ICD-10-CM

## 2014-01-08 DIAGNOSIS — Y9301 Activity, walking, marching and hiking: Secondary | ICD-10-CM | POA: Diagnosis not present

## 2014-01-08 DIAGNOSIS — Z86718 Personal history of other venous thrombosis and embolism: Secondary | ICD-10-CM | POA: Diagnosis not present

## 2014-01-08 DIAGNOSIS — T148 Other injury of unspecified body region: Secondary | ICD-10-CM | POA: Insufficient documentation

## 2014-01-08 DIAGNOSIS — Y998 Other external cause status: Secondary | ICD-10-CM | POA: Insufficient documentation

## 2014-01-08 DIAGNOSIS — Y92192 Bathroom in other specified residential institution as the place of occurrence of the external cause: Secondary | ICD-10-CM | POA: Insufficient documentation

## 2014-01-08 DIAGNOSIS — S3992XA Unspecified injury of lower back, initial encounter: Secondary | ICD-10-CM | POA: Diagnosis not present

## 2014-01-08 DIAGNOSIS — S79912A Unspecified injury of left hip, initial encounter: Secondary | ICD-10-CM | POA: Diagnosis present

## 2014-01-08 DIAGNOSIS — S34139A Unspecified injury to sacral spinal cord, initial encounter: Secondary | ICD-10-CM | POA: Insufficient documentation

## 2014-01-08 DIAGNOSIS — F909 Attention-deficit hyperactivity disorder, unspecified type: Secondary | ICD-10-CM | POA: Insufficient documentation

## 2014-01-08 MED ORDER — OXYCODONE-ACETAMINOPHEN 5-325 MG PO TABS
1.0000 | ORAL_TABLET | Freq: Once | ORAL | Status: AC
  Administered 2014-01-08: 1 via ORAL
  Filled 2014-01-08: qty 1

## 2014-01-08 MED ORDER — OXYCODONE-ACETAMINOPHEN 5-325 MG PO TABS: 1.0000 | ORAL_TABLET | Freq: Four times a day (QID) | ORAL | Status: DC | PRN

## 2014-01-08 NOTE — ED Provider Notes (Signed)
CSN: [id number]     Arrival date & time 01/08/14  1558 History   First MD Initiated Contact with Patient 01/08/14 1559     Chief Complaint  Patient presents with  . Fall  . 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 Patient presents to the emergency room with complaints of left hip and buttock pain. The patient was walking in his bathroom when he slipped and fell onto his left side. The patient was able to get up and stand but is having significant pain in his left buttock and left hip. Denies any head injury or loss of consciousness. He denies any other extremity injury. No new numbness or weakness. Past Medical History  Diagnosis Date  . ADD 05/25/2008  . Personal history of traumatic brain injury 05/15/2009  . ADD (attention deficit disorder with hyperactivity)   . Brain injury 2003  . ADD (attention deficit disorder)   . 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 Dvt      history of  . Foot surgery  2010    right foot  . Brain surgery  2003    tramatic injury   Family History  Problem Relation Age of Onset  . Heart disease Other   . Diabetes Other   . Diabetes Father   . Hyperlipidemia Father    History  Substance Use Topics  . Smoking status: Current Every Day Smoker  . Smokeless tobacco: Not on file  . Alcohol Use: Yes    Review of Systems  All other systems reviewed and are negative.     Allergies  Review of patient's allergies indicates no known allergies.  Home Medications   Prior to Admission medications   Medication Sig Start Date End Date Taking? Authorizing Provider  fish oil-omega-3 fatty acids 1000 MG capsule Take 1 g by mouth daily.    Yes Historical Provider, MD  methylphenidate (RITALIN LA) 30 MG 24 hr capsule Take 1 capsule (30 mg total) by mouth every morning. 11/20/13  Yes Kristian CoveyBruce W  Burchette, MD  Multiple Vitamins-Minerals (MENS 50+ MULTI VITAMIN/MIN PO) Take by mouth daily.     Yes Historical Provider, MD  HYDROcodone-acetaminophen (NORCO/VICODIN) 5-325 MG per tablet Take 1-2 tablets by mouth every 6 (six) hours as needed for moderate pain or severe pain. Patient not taking: Reported on 01/08/2014 10/16/13   Mora BellmanHannah S Merrell, PA-C  methylphenidate (RITALIN LA) 30 MG 24 hr capsule Take 1 capsule (30 mg total) by mouth every morning. May refill in one month. 11/20/13   Kristian CoveyBruce W Burchette, MD  methylphenidate (RITALIN LA) 30 MG 24 hr capsule Take 1 capsule (30 mg total) by mouth every morning. May refill in two months. 11/20/13   Kristian CoveyBruce W Burchette, MD  ondansetron (ZOFRAN) 4 MG tablet Take 1 tablet (4 mg total) by mouth every 6 (six) hours. Patient not taking: Reported on 01/08/2014 10/16/13   Mora BellmanHannah S Merrell, PA-C  oxyCODONE-acetaminophen (PERCOCET/ROXICET) 5-325 MG per tablet Take 1-2 tablets by mouth every 6 (six) hours as needed for severe pain. 01/08/14   Linwood DibblesJon Bardia Wangerin, MD   BP 120/74 mmHg  Pulse 94  Temp(Src) 98.4 F (36.9 C) (Oral)  Resp 18  SpO2 98% Physical Exam  Constitutional: He appears well-developed and well-nourished. No distress.  HENT:  Head: Normocephalic and atraumatic.  Right Ear:  External ear normal.  Left Ear: External ear normal.  Eyes: Conjunctivae are normal. Right eye exhibits no discharge. Left eye exhibits no discharge. No scleral icterus.  Neck: Neck supple. No tracheal deviation present.  Cardiovascular: Normal rate.   Pulmonary/Chest: Effort normal. No stridor. No respiratory distress.  Musculoskeletal: He exhibits no edema.       Left hip: He exhibits tenderness and bony tenderness.       Lumbar back: He exhibits no tenderness and no bony tenderness.  Tenderness to palpation in the sacrum and left buttock  Neurological: He is alert. Cranial nerve deficit: no gross deficits.  Left-sided hemiparesis with some atrophy and contractures of the  left upper extremity  Skin: Skin is warm and dry. No rash noted.  Psychiatric: He has a normal mood and affect.  Nursing note and vitals reviewed.   ED Course  Procedures (including critical care time) Labs Review Labs Reviewed - No data to display  Imaging Review Dg Lumbar Spine Complete  01/08/2014   CLINICAL DATA:  Low back, tail bone and LEFT hip pain post fall today, was walking when tripped and fell landing on LEFT side  EXAM: LUMBAR SPINE - COMPLETE 4+ VIEW  COMPARISON:  None  FINDINGS: Five non-rib-bearing lumbar vertebrae.  Osseous mineralization normal.  Vertebral body and disc space heights maintained.  No acute fracture, subluxation or bone destruction.  No spondylolysis.  SI joints symmetric.  Questionable 2-3 mm calculus projecting over inferior pole LEFT kidney.  IMPRESSION: No acute lumbar spine abnormalities.  Questionable to 3 mm diameter calculus projecting over inferior pole LEFT kidney.   Electronically Signed   By: Ulyses SouthwardMark  Boles M.D.   On: 01/08/2014 16:56   Dg Pelvis 1-2 Views  01/08/2014   CLINICAL DATA:  Larey SeatFell today, having low back CT, at tailbone, and LEFT hip pain, tripped and fell while walking landing on LEFT side, initial encounter  EXAM: PELVIS - 1-2 VIEW  COMPARISON:  None  FINDINGS: Osseous mineralization normal for technique.  Symmetric preserved hip and SI joints.  No acute fracture, dislocation, or bone destruction.  Superimposed artifacts.  IMPRESSION: Normal exam.   Electronically Signed   By: Ulyses SouthwardMark  Boles M.D.   On: 01/08/2014 16:55   Dg Femur Left  01/08/2014   CLINICAL DATA:  Pain after fall.  [id number].  EXAM: LEFT FEMUR - 2 VIEW  COMPARISON:  None.  FINDINGS: No acute fracture or dislocation.  Joint spaces maintained.  IMPRESSION: No acute osseous abnormality.   Electronically Signed   By: Jeronimo GreavesKyle  Talbot M.D.   On: 01/08/2014 16:58      MDM   Final diagnoses:  Pain  Contusion   No evidence of serious injury.  Consistent with soft tissue  injury/strain.  Explained findings to patient and warning signs that should prompt return to the ED.     Linwood DibblesJon Kataleah Bejar, MD 01/08/14 [phone]

## 2014-01-08 NOTE — ED Notes (Signed)
Brought in by EMS from home with c/o left hip pain after his fall today.  Pt reports that he was walking when he tripped and fell, landing on his left side--- has had immediate pain to left hip after the fall.  Pt denies hitting head.  Pt has hx traumatic brain injury with left-sided weakness.

## 2014-01-08 NOTE — ED Notes (Signed)
Bed: WA01 Expected date:  Expected time:  Means of arrival:  Comments: EMS - generalized pain

## 2014-01-08 NOTE — Discharge Instructions (Signed)
Contusion °A contusion is a deep bruise. Contusions are the result of an injury that caused bleeding under the skin. The contusion may turn blue, purple, or yellow. Minor injuries will give you a painless contusion, but more severe contusions may stay painful and swollen for a few weeks.  °CAUSES  °A contusion is usually caused by a blow, trauma, or direct force to an area of the body. °SYMPTOMS  °· Swelling and redness of the injured area. °· Bruising of the injured area. °· Tenderness and soreness of the injured area. °· Pain. °DIAGNOSIS  °The diagnosis can be made by taking a history and physical exam. An X-ray, CT scan, or MRI may be needed to determine if there were any associated injuries, such as fractures. °TREATMENT  °Specific treatment will depend on what area of the body was injured. In general, the best treatment for a contusion is resting, icing, elevating, and applying cold compresses to the injured area. Over-the-counter medicines may also be recommended for pain control. Ask your caregiver what the best treatment is for your contusion. °HOME CARE INSTRUCTIONS  °· Put ice on the injured area. °¨ Put ice in a plastic bag. °¨ Place a towel between your skin and the bag. °¨ Leave the ice on for 15-20 minutes, 3-4 times a day, or as directed by your health care provider. °· Only take over-the-counter or prescription medicines for pain, discomfort, or fever as directed by your caregiver. Your caregiver may recommend avoiding anti-inflammatory medicines (aspirin, ibuprofen, and naproxen) for 48 hours because these medicines may increase bruising. °· Rest the injured area. °· If possible, elevate the injured area to reduce swelling. °SEEK IMMEDIATE MEDICAL CARE IF:  °· You have increased bruising or swelling. °· You have pain that is getting worse. °· Your swelling or pain is not relieved with medicines. °MAKE SURE YOU:  °· Understand these instructions. °· Will watch your condition. °· Will get help right  away if you are not doing well or get worse. °Document Released: 11/05/2004 Document Revised: 01/31/2013 Document Reviewed: 12/01/2010 °[employer]® Patient Information ©2015 [employer]. This information is not intended to replace advice given to you by your health care provider. Make sure you discuss any questions you have with your health care provider. ° °

## 2014-02-15 ENCOUNTER — Telehealth: Payer: Self-pay | Admitting: Family Medicine

## 2014-02-15 MED ORDER — METHYLPHENIDATE HCL ER (LA) 30 MG PO CP24: 30.0000 mg | ORAL_CAPSULE | ORAL | Status: DC

## 2014-02-15 NOTE — Telephone Encounter (Signed)
Pt needs new rx ritalin la 30 mg

## 2014-02-15 NOTE — Telephone Encounter (Signed)
Last visit 03/15/13 Last refill 11/20/13 #30 2 refills

## 2014-02-15 NOTE — Telephone Encounter (Signed)
Pt informed. That RX is ready for pickup.

## 2014-02-15 NOTE — Telephone Encounter (Signed)
Refill once. Needs office follow-up within one month

## 2014-03-15 ENCOUNTER — Other Ambulatory Visit: Payer: Self-pay | Admitting: Family Medicine

## 2014-03-15 ENCOUNTER — Other Ambulatory Visit (INDEPENDENT_AMBULATORY_CARE_PROVIDER_SITE_OTHER): Payer: BLUE CROSS/BLUE SHIELD

## 2014-03-15 DIAGNOSIS — Z0000 Encounter for general adult medical examination without abnormal findings: Secondary | ICD-10-CM

## 2014-03-15 LAB — LIPID PANEL
Cholesterol: 127 mg/dL (ref 0–200)
HDL: 40.5 mg/dL (ref >39.00)
LDL Cholesterol: 69 mg/dL (ref 0–99)
NONHDL: 86.5
TRIGLYCERIDES: 88 mg/dL (ref 0.0–149.0)
Total CHOL/HDL Ratio: 3
VLDL: 17.6 mg/dL (ref 0.0–40.0)

## 2014-03-15 LAB — CBC WITH DIFFERENTIAL/PLATELET
BASOS ABS: 0 10*3/uL (ref 0.0–0.1)
BASOS PCT: 0.4 % (ref 0.0–3.0)
EOS ABS: 0.7 10*3/uL (ref 0.0–0.7)
Eosinophils Relative: 6.5 % — ABNORMAL HIGH (ref 0.0–5.0)
HEMATOCRIT: 49 % (ref 39.0–52.0)
HEMOGLOBIN: 16.6 g/dL (ref 13.0–17.0)
LYMPHS ABS: 3.6 10*3/uL (ref 0.7–4.0)
LYMPHS PCT: 34.2 % (ref 12.0–46.0)
MCHC: 34 g/dL (ref 30.0–36.0)
MCV: 87.8 fl (ref 78.0–100.0)
Monocytes Absolute: 0.8 10*3/uL (ref 0.1–1.0)
Monocytes Relative: 7.3 % (ref 3.0–12.0)
Neutro Abs: 5.4 10*3/uL (ref 1.4–7.7)
Neutrophils Relative %: 51.6 % (ref 43.0–77.0)
Platelets: 274 10*3/uL (ref 150.0–400.0)
RBC: 5.57 Mil/uL (ref 4.22–5.81)
RDW: 13.4 % (ref 11.5–15.5)
WBC: 10.5 10*3/uL (ref 4.0–10.5)

## 2014-03-15 LAB — BASIC METABOLIC PANEL
BUN: 12 mg/dL (ref 6–23)
CHLORIDE: 106 meq/L (ref 96–112)
CO2: 27 meq/L (ref 19–32)
Calcium: 9.4 mg/dL (ref 8.4–10.5)
Creatinine, Ser: 0.79 mg/dL (ref 0.40–1.50)
GFR: 121.97 mL/min (ref >60.00)
Glucose, Bld: 83 mg/dL (ref 70–99)
POTASSIUM: 4.4 meq/L (ref 3.5–5.1)
Sodium: 141 mEq/L (ref 135–145)

## 2014-03-15 LAB — HEPATIC FUNCTION PANEL
ALK PHOS: 93 U/L (ref 39–117)
ALT: 26 U/L (ref 0–53)
AST: 14 U/L (ref 0–37)
Albumin: 4 g/dL (ref 3.5–5.2)
Bilirubin, Direct: 0.1 mg/dL (ref 0.0–0.3)
TOTAL PROTEIN: 6.9 g/dL (ref 6.0–8.3)
Total Bilirubin: 0.4 mg/dL (ref 0.2–1.2)

## 2014-03-15 LAB — POCT URINALYSIS DIP (MANUAL ENTRY)
Bilirubin, UA: NEGATIVE
GLUCOSE UA: NEGATIVE
Ketones, POC UA: NEGATIVE
Leukocytes, UA: NEGATIVE
Nitrite, UA: NEGATIVE
Protein Ur, POC: NEGATIVE
RBC UA: NEGATIVE
Spec Grav, UA: 1.015
UROBILINOGEN UA: 0.2
pH, UA: 6.5

## 2014-03-15 LAB — TSH: TSH: 2.29 u[IU]/mL (ref 0.35–4.50)

## 2014-03-22 ENCOUNTER — Ambulatory Visit (INDEPENDENT_AMBULATORY_CARE_PROVIDER_SITE_OTHER): Payer: BLUE CROSS/BLUE SHIELD | Admitting: Family Medicine

## 2014-03-22 ENCOUNTER — Encounter: Payer: Self-pay | Admitting: Family Medicine

## 2014-03-22 VITALS — BP 130/80 | HR 98 | Temp 97.6°F | Wt 221.0 lb

## 2014-03-22 DIAGNOSIS — Z23 Encounter for immunization: Secondary | ICD-10-CM

## 2014-03-22 DIAGNOSIS — Z0000 Encounter for general adult medical examination without abnormal findings: Secondary | ICD-10-CM

## 2014-03-22 MED ORDER — METHYLPHENIDATE HCL ER (LA) 30 MG PO CP24: 30.0000 mg | ORAL_CAPSULE | ORAL | Status: DC

## 2014-03-22 NOTE — Addendum Note (Signed)
Addended by: Thomasena EdisFLOYD, Joanann Mies E on: 03/22/2014 02:49 PM   Modules accepted: Orders

## 2014-03-22 NOTE — Patient Instructions (Signed)
Continue with regular exercise and try to lose weight. Try to scale back sugars such as sweetened tea.

## 2014-03-22 NOTE — Progress Notes (Signed)
Pre visit review using our clinic review tool, if applicable. No additional management support is needed unless otherwise documented below in the visit note. 
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
## 2014-05-03 ENCOUNTER — Encounter: Payer: Self-pay | Admitting: Family Medicine

## 2014-05-03 ENCOUNTER — Ambulatory Visit (INDEPENDENT_AMBULATORY_CARE_PROVIDER_SITE_OTHER): Payer: BLUE CROSS/BLUE SHIELD | Admitting: Family Medicine

## 2014-05-03 VITALS — BP 124/80 | HR 89 | Temp 98.3°F | Wt 220.0 lb

## 2014-05-03 DIAGNOSIS — Z87820 Personal history of traumatic brain injury: Secondary | ICD-10-CM | POA: Diagnosis not present

## 2014-05-03 NOTE — Progress Notes (Signed)
Pre visit review using our clinic review tool, if applicable. No additional management support is needed unless otherwise documented below in the visit note. 
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
## 2014-05-09 ENCOUNTER — Telehealth: Payer: Self-pay | Admitting: Family Medicine

## 2014-05-09 NOTE — Telephone Encounter (Signed)
[facility]: Attn: [facility] (f) [phone].

## 2014-05-09 NOTE — Telephone Encounter (Signed)
Faxed forms to DMV 

## 2014-06-18 ENCOUNTER — Telehealth: Payer: Self-pay | Admitting: Family Medicine

## 2014-06-18 MED ORDER — METHYLPHENIDATE HCL ER (LA) 30 MG PO CP24: 30.0000 mg | ORAL_CAPSULE | ORAL | Status: DC

## 2014-06-18 NOTE — Telephone Encounter (Signed)
Pt request refill of the following: methylphenidate (RITALIN LA) 30 MG 24 hr capsule ° ° °Phamacy: °

## 2014-06-18 NOTE — Telephone Encounter (Signed)
Pt is aware that RX is ready for pick up. 

## 2014-06-18 NOTE — Telephone Encounter (Signed)
Refill OK

## 2014-06-18 NOTE — Telephone Encounter (Signed)
Last visit 05/03/14 Last refill 03/22/14 #30 2 refill

## 2014-09-17 ENCOUNTER — Telehealth: Payer: Self-pay | Admitting: Family Medicine

## 2014-09-17 MED ORDER — METHYLPHENIDATE HCL ER (LA) 30 MG PO CP24: 30.0000 mg | ORAL_CAPSULE | ORAL | Status: DC

## 2014-09-17 NOTE — Telephone Encounter (Signed)
Left message on VM that Rx is ready for pick up 

## 2014-09-17 NOTE — Telephone Encounter (Signed)
Last visit 05/03/14 Last refill 06/18/14 #30 2 refill

## 2014-09-17 NOTE — Telephone Encounter (Signed)
Pt request refill  °methylphenidate (RITALIN LA) 30 MG 24 hr capsule °3 mo supply °

## 2014-09-17 NOTE — Telephone Encounter (Signed)
Refills OK. 

## 2014-12-12 ENCOUNTER — Telehealth: Payer: Self-pay | Admitting: Family Medicine

## 2014-12-12 NOTE — Telephone Encounter (Signed)
Pt request refill of the following: methylphenidate (RITALIN LA) 30 MG 24 hr capsule   Phamacy:

## 2014-12-12 NOTE — Telephone Encounter (Signed)
Last fill was 09/17/14 x3--Due 11/8. Last OV was 05/03/2014 -- no pending. Please advise if okay to fill when due in BB absence.

## 2014-12-13 ENCOUNTER — Other Ambulatory Visit: Payer: Self-pay | Admitting: *Deleted

## 2014-12-13 MED ORDER — METHYLPHENIDATE HCL ER (LA) 30 MG PO CP24: 30.0000 mg | ORAL_CAPSULE | ORAL | Status: DC

## 2014-12-13 NOTE — Telephone Encounter (Signed)
Dr. Clent RidgesFry approved #30 with no refills. Patient is aware ready to pick up and cannot refill until Tuesday 11/8.

## 2014-12-13 NOTE — Telephone Encounter (Signed)
Rx printed for #30 no refills. Dr. Clent RidgesFry approval in Dr. Caryl NeverBurchette absence.

## 2015-01-14 ENCOUNTER — Telehealth: Payer: Self-pay | Admitting: Family Medicine

## 2015-01-14 MED ORDER — METHYLPHENIDATE HCL ER (LA) 30 MG PO CP24: 30.0000 mg | ORAL_CAPSULE | ORAL | Status: DC

## 2015-01-14 NOTE — Telephone Encounter (Signed)
Pt is aware that RX has been signed and up front for pick up. He is also aware that he needs a f/u appt.

## 2015-01-14 NOTE — Telephone Encounter (Signed)
Pt needs new rx ritalin 30 mg

## 2015-02-15 ENCOUNTER — Ambulatory Visit (INDEPENDENT_AMBULATORY_CARE_PROVIDER_SITE_OTHER): Payer: BLUE CROSS/BLUE SHIELD | Admitting: Family Medicine

## 2015-02-15 ENCOUNTER — Encounter: Payer: Self-pay | Admitting: Family Medicine

## 2015-02-15 VITALS — BP 118/80 | Temp 98.6°F | Ht 67.5 in | Wt 223.5 lb

## 2015-02-15 DIAGNOSIS — F988 Other specified behavioral and emotional disorders with onset usually occurring in childhood and adolescence: Secondary | ICD-10-CM

## 2015-02-15 DIAGNOSIS — F909 Attention-deficit hyperactivity disorder, unspecified type: Secondary | ICD-10-CM | POA: Diagnosis not present

## 2015-02-15 MED ORDER — METHYLPHENIDATE HCL ER (LA) 30 MG PO CP24: 30.0000 mg | ORAL_CAPSULE | ORAL | Status: DC

## 2015-02-15 MED ORDER — METHYLPHENIDATE HCL ER (LA) 30 MG PO CP24: ORAL_CAPSULE | ORAL | Status: DC

## 2015-02-15 NOTE — Progress Notes (Signed)
Pre visit review using our clinic review tool, if applicable. No additional management support is needed unless otherwise documented below in the visit note. 

## 2015-02-15 NOTE — Progress Notes (Signed)
   Subjective:    Patient ID: Joseph Pittman, male    DOB: [birth year], 30 y.o.   MRN: [id number]  HPI Follow-up attention deficit disorder. Patient has been for several years on Ritalin 30 mg extended release once daily. He has history of traumatic brain injury several years ago. He has increased motivation and ability to focus when taking the Ritalin. No side effects. No history of hypertension.  Remote history of DVT. Has not had any recurrence now for several years. No complaints today.  Past Medical History  Diagnosis Date  . ADD 05/25/2008  . Personal history of traumatic brain injury 05/15/2009  . ADD (attention deficit disorder with hyperactivity)   . Brain injury (HCC) 2003  . ADD (attention deficit disorder)   . Clotting disorder [facility](HCC) 2008    dvt   Past Surgical History  Procedure Laterality Date  . Dvt      history of  . Foot surgery  2010    right foot  . Brain surgery  2003    tramatic injury    reports that he has been smoking.  He does not have any smokeless tobacco history on file. He reports that he drinks alcohol. He reports that he does not use illicit drugs. family history includes Diabetes in his father and other; Heart disease in his other; Hyperlipidemia in his father. No Known Allergies    Review of Systems  Constitutional: Negative for fatigue.  Eyes: Negative for visual disturbance.  Respiratory: Negative for cough, chest tightness and shortness of breath.   Cardiovascular: Negative for chest pain, palpitations and leg swelling.  Neurological: Negative for dizziness, syncope, weakness, light-headedness and headaches.       Objective:   Physical Exam  Constitutional: He is oriented to person, place, and time. He appears well-developed and well-nourished.  HENT:  Right Ear: External ear normal.  Left Ear: External ear normal.  Mouth/Throat: Oropharynx is clear and moist.  Eyes: Pupils are equal, round, and reactive to light.  Neck: Neck supple.  No thyromegaly present.  Cardiovascular: Normal rate and regular rhythm.   Pulmonary/Chest: Effort normal and breath sounds normal. No respiratory distress. He has no wheezes. He has no rales.  Musculoskeletal: He exhibits no edema.  Neurological: He is alert and oriented to person, place, and time.          Assessment & Plan:  ADD. Stable. Refill Ritalin for 3 months. Flu vaccine recommended but declined

## 2015-05-13 ENCOUNTER — Other Ambulatory Visit: Payer: Self-pay | Admitting: Family Medicine

## 2015-05-13 MED ORDER — METHYLPHENIDATE HCL ER (LA) 30 MG PO CP24: ORAL_CAPSULE | ORAL | Status: DC

## 2015-05-13 MED ORDER — METHYLPHENIDATE HCL ER (LA) 30 MG PO CP24: 30.0000 mg | ORAL_CAPSULE | ORAL | Status: DC

## 2015-05-13 NOTE — Telephone Encounter (Signed)
Pt notified Rx's ready for pickup. Rx's printed and signed by Dr. Burchette. 

## 2015-05-13 NOTE — Telephone Encounter (Signed)
Pt needs new rx ritalin °

## 2015-07-17 ENCOUNTER — Encounter: Payer: Self-pay | Admitting: Family Medicine

## 2015-07-17 ENCOUNTER — Ambulatory Visit (INDEPENDENT_AMBULATORY_CARE_PROVIDER_SITE_OTHER): Payer: BLUE CROSS/BLUE SHIELD | Admitting: Family Medicine

## 2015-07-17 VITALS — BP 118/82 | HR 104 | Temp 98.1°F | Ht 67.5 in | Wt 218.0 lb

## 2015-07-17 DIAGNOSIS — Z87820 Personal history of traumatic brain injury: Secondary | ICD-10-CM
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
## 2015-08-15 ENCOUNTER — Telehealth: Payer: Self-pay | Admitting: Family Medicine

## 2015-08-15 NOTE — Telephone Encounter (Signed)
Refills OK. 

## 2015-08-15 NOTE — Telephone Encounter (Signed)
Pt need new Rx for methylphenidate (RITALIN)

## 2015-08-15 NOTE — Telephone Encounter (Signed)
Last refill 05/13/2015 x3 Last appt 07/17/2015 Okay for refills?

## 2015-08-16 MED ORDER — METHYLPHENIDATE HCL ER (LA) 30 MG PO CP24: ORAL_CAPSULE | ORAL | Status: DC

## 2015-08-16 MED ORDER — METHYLPHENIDATE HCL ER (LA) 30 MG PO CP24: 30.0000 mg | ORAL_CAPSULE | ORAL | Status: DC

## 2015-08-16 NOTE — Telephone Encounter (Signed)
Printed for signature

## 2015-08-16 NOTE — Telephone Encounter (Signed)
Pt is aware that scripts are up front for pick up. 

## 2015-10-16 IMAGING — CR DG LUMBAR SPINE COMPLETE 4+V
6 series · 6 of 6 positions shown · non-contrast
Comparison: None

CLINICAL DATA: Low back, tail bone and LEFT hip pain post fall
today, was walking when tripped and fell landing on LEFT side

EXAM:
LUMBAR SPINE - COMPLETE 4+ VIEW

[t lumbar spine ap]
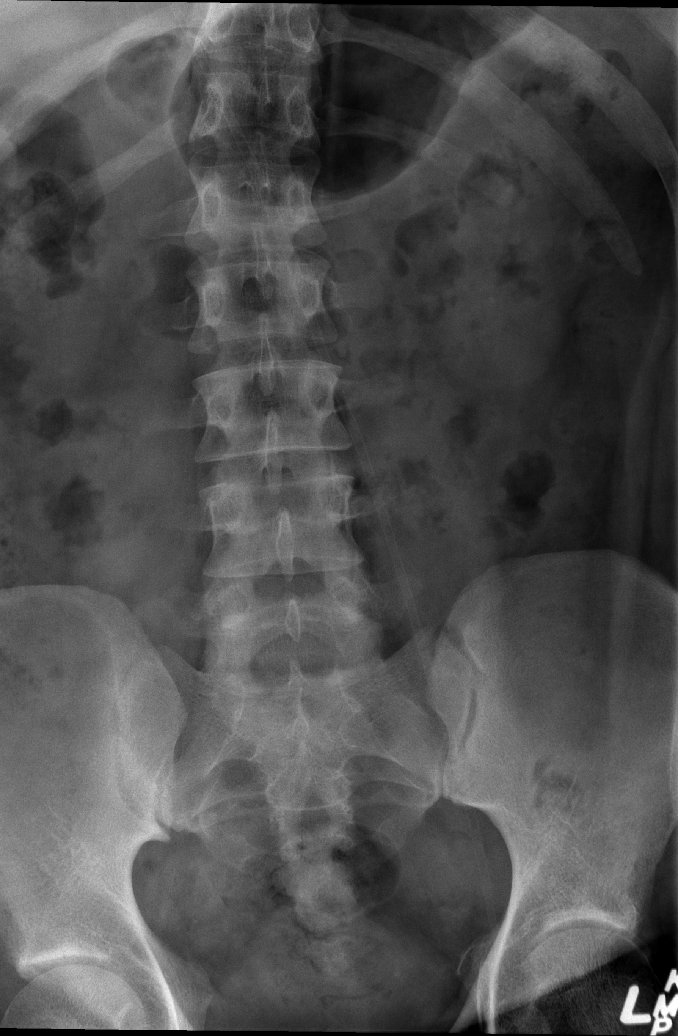

[t lumbar spine obl (1 of 2)]
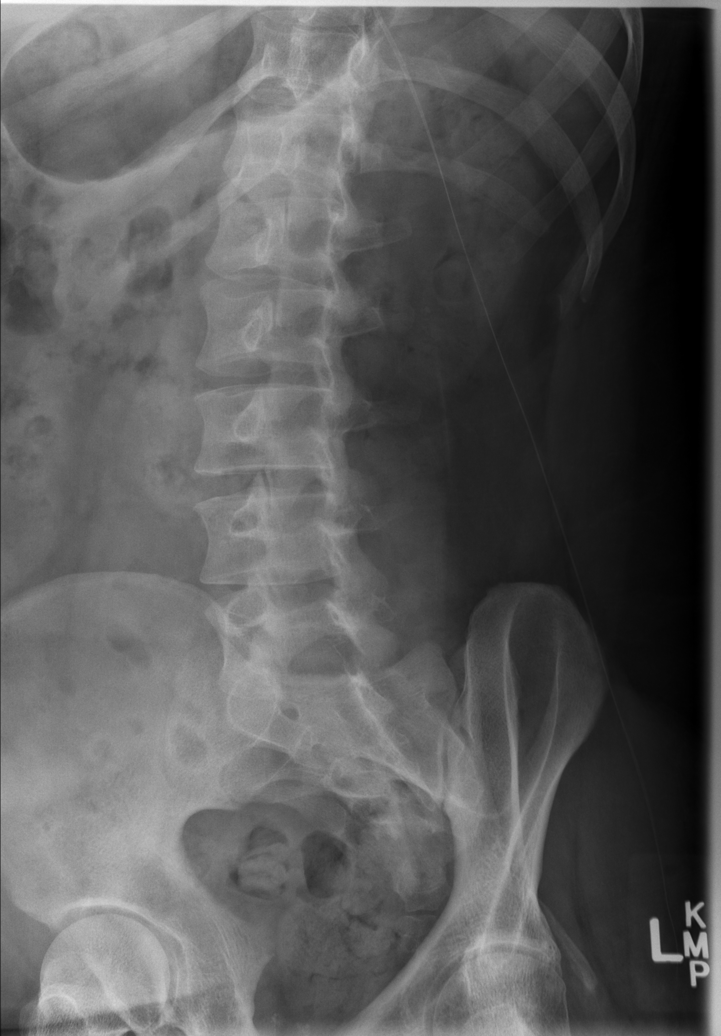

[t lumbar spine obl (2 of 2)]
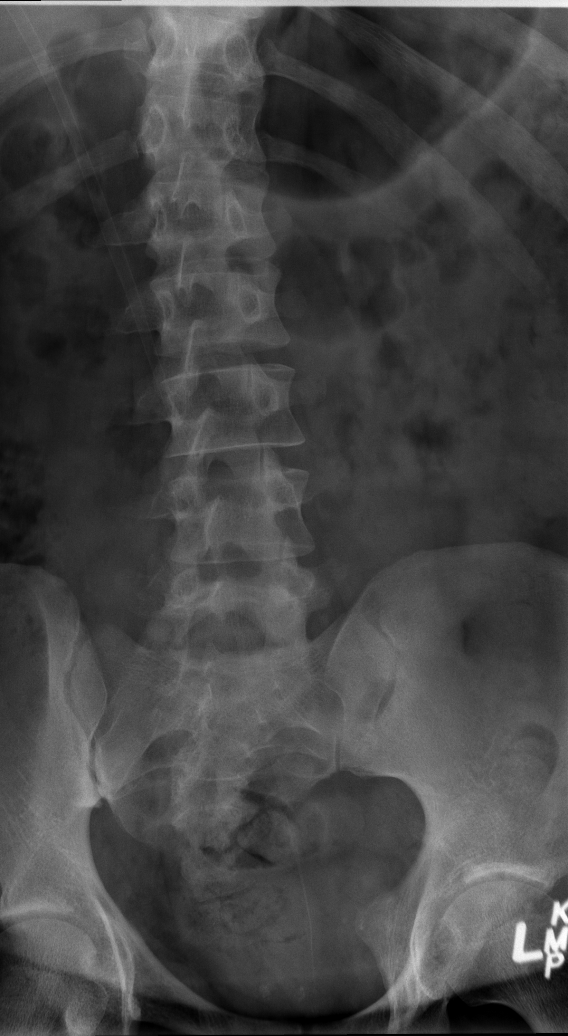

[w lumbar spine lat (1 of 3)]
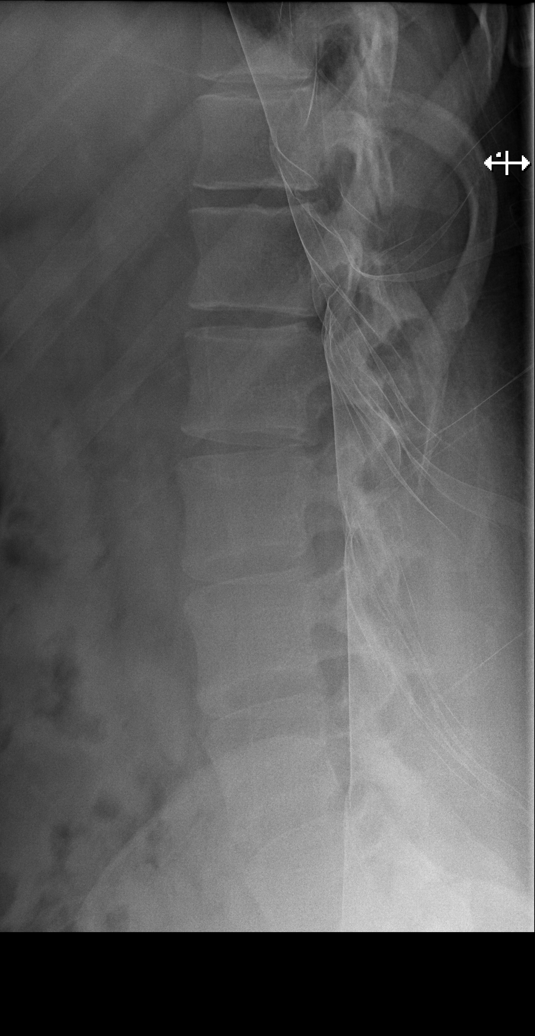

[w lumbar spine lat (2 of 3)]
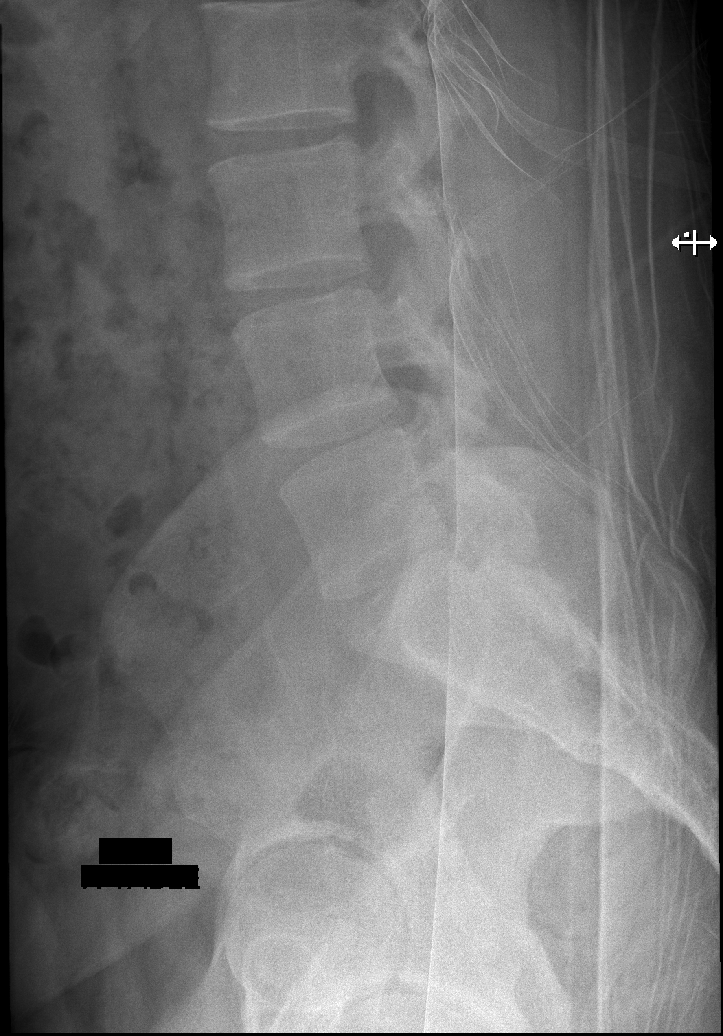

[w lumbar spine lat (3 of 3)]
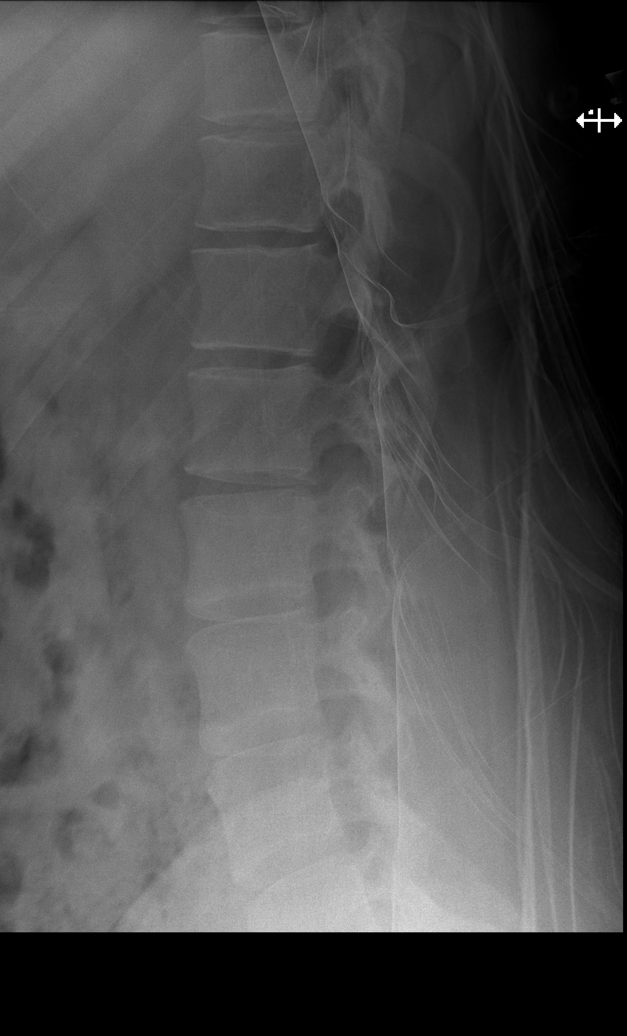

[6 of 6 positions shown; findings below may reference images not displayed]

FINDINGS: Five non-rib-bearing lumbar vertebrae.

Osseous mineralization normal.

Vertebral body and disc space heights maintained.

No acute fracture, subluxation or bone destruction.

No spondylolysis.

SI joints symmetric.

Questionable 2-3 mm calculus projecting over inferior pole LEFT
kidney.
IMPRESSION: No acute lumbar spine abnormalities.

Questionable to 3 mm diameter calculus projecting over inferior pole
LEFT kidney.

## 2015-11-19 ENCOUNTER — Telehealth: Payer: Self-pay | Admitting: Family Medicine

## 2015-11-19 NOTE — Telephone Encounter (Signed)
Last OV 07/17/2015  Last refill 08/16/2015 X3  Please advise

## 2015-11-19 NOTE — Telephone Encounter (Signed)
Refill OK

## 2015-11-19 NOTE — Telephone Encounter (Signed)
Pt request refill  methylphenidate (RITALIN LA) 30 MG 24 hr capsule 3 mo supply

## 2015-11-20 MED ORDER — METHYLPHENIDATE HCL ER (LA) 30 MG PO CP24: 30.0000 mg | ORAL_CAPSULE | ORAL | 0 refills | Status: DC

## 2015-11-20 MED ORDER — METHYLPHENIDATE HCL ER (LA) 30 MG PO CP24: ORAL_CAPSULE | ORAL | 0 refills | Status: DC

## 2015-11-20 NOTE — Telephone Encounter (Signed)
Printed for signature

## 2015-11-20 NOTE — Telephone Encounter (Signed)
Pt is aware that script is up front for pick up.  

## 2016-01-29 ENCOUNTER — Telehealth: Payer: Self-pay

## 2016-01-29 NOTE — Telephone Encounter (Signed)
Received PA request from [facility] for Ritalin LA 30 mg capsules. PA submitted & is pending. Key: [id number]

## 2016-01-29 NOTE — Telephone Encounter (Signed)
error 

## 2016-01-29 NOTE — Telephone Encounter (Signed)
PA Approved, form faxed back to pharmacy. 

## 2016-02-20 ENCOUNTER — Telehealth: Payer: Self-pay | Admitting: Family Medicine

## 2016-02-20 NOTE — Telephone Encounter (Signed)
Last OV 07/21/15  Last filled 11/20/15 # 30 refills 0  Okay to refill?

## 2016-02-20 NOTE — Telephone Encounter (Signed)
Pt request refill  methylphenidate (RITALIN LA) 30 MG 24 hr capsules 3 mo supply

## 2016-02-20 NOTE — Telephone Encounter (Signed)
Refills OK. 

## 2016-02-24 ENCOUNTER — Other Ambulatory Visit: Payer: Self-pay | Admitting: Emergency Medicine

## 2016-02-24 MED ORDER — METHYLPHENIDATE HCL ER (LA) 30 MG PO CP24: 30.0000 mg | ORAL_CAPSULE | ORAL | 0 refills | Status: DC

## 2016-02-24 MED ORDER — METHYLPHENIDATE HCL ER (LA) 30 MG PO CP24: ORAL_CAPSULE | ORAL | 0 refills | Status: DC

## 2016-02-24 NOTE — Telephone Encounter (Signed)
Called and spoke with pt informing him that prescription has been printed and place up front for pickup. Understanding verbalized nothing further needed at this time.

## 2016-05-22 ENCOUNTER — Telehealth: Payer: Self-pay | Admitting: Family Medicine

## 2016-05-22 MED ORDER — METHYLPHENIDATE HCL ER (LA) 30 MG PO CP24: 30.0000 mg | ORAL_CAPSULE | ORAL | 0 refills | Status: DC

## 2016-05-22 MED ORDER — METHYLPHENIDATE HCL ER (LA) 30 MG PO CP24: ORAL_CAPSULE | ORAL | 0 refills | Status: DC

## 2016-05-22 NOTE — Telephone Encounter (Signed)
methylphenidate (RITALIN LA) 30 MG 24 hr capsule Last refill 02/24/16.  Last office visit 07/17/15.  Okay to fill?

## 2016-05-22 NOTE — Telephone Encounter (Signed)
Refills OK but will then need follow up.

## 2016-05-22 NOTE — Telephone Encounter (Signed)
° ° ° °  Pt request refill of the following:  methylphenidate (RITALIN LA) 30 MG 24 hr capsule   Phamacy:

## 2016-05-22 NOTE — Telephone Encounter (Signed)
Patient is aware.  rx is ready . Appointment needed.

## 2016-06-22 ENCOUNTER — Ambulatory Visit (INDEPENDENT_AMBULATORY_CARE_PROVIDER_SITE_OTHER): Payer: BLUE CROSS/BLUE SHIELD | Admitting: Family Medicine

## 2016-06-22 ENCOUNTER — Encounter: Payer: Self-pay | Admitting: Family Medicine

## 2016-06-22 VITALS — BP 110/80 | HR 79 | Temp 98.2°F | Ht 67.0 in | Wt 221.7 lb

## 2016-06-22 DIAGNOSIS — Z0000 Encounter for general adult medical examination without abnormal findings: Secondary | ICD-10-CM | POA: Diagnosis not present

## 2016-06-22 LAB — BASIC METABOLIC PANEL
BUN: 9 mg/dL (ref 6–23)
CHLORIDE: 107 meq/L (ref 96–112)
CO2: 26 meq/L (ref 19–32)
Calcium: 9.3 mg/dL (ref 8.4–10.5)
Creatinine, Ser: 0.7 mg/dL (ref 0.40–1.50)
GFR: 138.22 mL/min (ref >60.00)
GLUCOSE: 91 mg/dL (ref 70–99)
POTASSIUM: 4.2 meq/L (ref 3.5–5.1)
Sodium: 141 mEq/L (ref 135–145)

## 2016-06-22 LAB — LIPID PANEL
Cholesterol: 125 mg/dL (ref 0–200)
HDL: 40 mg/dL (ref >39.00)
LDL CALC: 72 mg/dL (ref 0–99)
NonHDL: 84.86
Total CHOL/HDL Ratio: 3
Triglycerides: 64 mg/dL (ref 0.0–149.0)
VLDL: 12.8 mg/dL (ref 0.0–40.0)
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
## 2016-08-27 ENCOUNTER — Telehealth: Payer: Self-pay | Admitting: Family Medicine

## 2016-08-27 NOTE — Telephone Encounter (Signed)
Last refill 05/22/16 and last office visit 06/22/16.  Okay to fill?

## 2016-08-27 NOTE — Telephone Encounter (Signed)
Pt needs new rx generic ritalin la 30  mg

## 2016-08-28 MED ORDER — METHYLPHENIDATE HCL ER (LA) 30 MG PO CP24: 30.0000 mg | ORAL_CAPSULE | ORAL | 0 refills | Status: DC

## 2016-08-28 MED ORDER — METHYLPHENIDATE HCL ER (LA) 30 MG PO CP24: ORAL_CAPSULE | ORAL | 0 refills | Status: DC

## 2016-08-28 NOTE — Telephone Encounter (Signed)
rx ready for pick up and patient is aware  

## 2016-08-28 NOTE — Telephone Encounter (Signed)
Refills okay 

## 2016-11-30 ENCOUNTER — Telehealth: Payer: Self-pay | Admitting: Family Medicine

## 2016-11-30 MED ORDER — METHYLPHENIDATE HCL ER (LA) 30 MG PO CP24: ORAL_CAPSULE | ORAL | 0 refills | Status: DC

## 2016-11-30 MED ORDER — METHYLPHENIDATE HCL ER (LA) 30 MG PO CP24: 30.0000 mg | ORAL_CAPSULE | ORAL | 0 refills | Status: DC

## 2016-11-30 NOTE — Telephone Encounter (Signed)
Refills OK. 

## 2016-11-30 NOTE — Telephone Encounter (Signed)
rx ready for pick up and patient is aware  

## 2016-11-30 NOTE — Telephone Encounter (Signed)
° ° ° ° ° °  Pt request refill of the following:  methylphenidate (RITALIN LA) 30 MG 24 hr capsule  Phamacy:

## 2016-11-30 NOTE — Telephone Encounter (Signed)
Last refill 08/28/16 and last office visit 06/22/16.  Okay to fill?

## 2017-03-03 ENCOUNTER — Telehealth: Payer: Self-pay | Admitting: Family Medicine

## 2017-03-03 NOTE — Telephone Encounter (Signed)
Refills OK. 

## 2017-03-03 NOTE — Telephone Encounter (Signed)
Pt requesting refill on Methylphenidate Erla.  LOV was 06/22/16.

## 2017-03-03 NOTE — Telephone Encounter (Signed)
Copied from CRM [phone]. Topic: General - Other >> Mar 03, 2017  3:08 PM [name], [name] wrote:  Methylphenidate Erla 30mg   Pt is needing a written Rx to get more.  Pt only has 3 days left on hand.

## 2017-03-05 MED ORDER — METHYLPHENIDATE HCL ER (LA) 30 MG PO CP24: ORAL_CAPSULE | ORAL | 0 refills | Status: DC

## 2017-03-05 MED ORDER — METHYLPHENIDATE HCL ER (LA) 30 MG PO CP24: 30.0000 mg | ORAL_CAPSULE | ORAL | 0 refills | Status: DC

## 2017-03-05 NOTE — Telephone Encounter (Signed)
rx ready for pick up and patient is aware  

## 2017-05-21 ENCOUNTER — Encounter: Payer: Self-pay | Admitting: Family Medicine

## 2017-05-21 DIAGNOSIS — H4922 Sixth [abducent] nerve palsy, left eye: Secondary | ICD-10-CM | POA: Diagnosis not present

## 2017-05-21 DIAGNOSIS — H5702 Anisocoria: Secondary | ICD-10-CM | POA: Diagnosis not present

## 2017-05-21 DIAGNOSIS — H02402 Unspecified ptosis of left eyelid: Secondary | ICD-10-CM | POA: Diagnosis not present

## 2017-05-21 DIAGNOSIS — H4902 Third [oculomotor] nerve palsy, left eye: Secondary | ICD-10-CM | POA: Diagnosis not present

## 2017-05-21 DIAGNOSIS — H5010 Unspecified exotropia: Secondary | ICD-10-CM | POA: Diagnosis not present

## 2017-05-21 DIAGNOSIS — H5022 Vertical strabismus, left eye: Secondary | ICD-10-CM | POA: Diagnosis not present

## 2017-06-07 ENCOUNTER — Telehealth: Payer: Self-pay | Admitting: Family Medicine

## 2017-06-07 NOTE — Telephone Encounter (Signed)
Refill once.  Pt needs follow up by next month.

## 2017-06-07 NOTE — Telephone Encounter (Signed)
Copied from CRM [id number]. Topic: Quick Communication - Rx Refill/Question >> Jun 07, 2017 11:53 AM Alexander Bergeron B wrote: Medication: methylphenidate (RITALIN LA) 30 MG 24 hr capsule [[id number]]  Has the patient contacted their pharmacy? Yes.   (Agent: If no, request that the patient contact the pharmacy for the refill.) [facility] Agent: Please be advised that RX refills may take up to 3 business days. We ask that you follow-up with your pharmacy.

## 2017-06-07 NOTE — Telephone Encounter (Signed)
Request for refill of Methylphenidate (RITALIN LA)  24hr cap. Last filled on 05/03/17  LOV: 06/22/16  PCP[facility] Nev[facility]      [address]

## 2017-06-08 MED ORDER — METHYLPHENIDATE HCL ER (LA) 30 MG PO CP24: 30.0000 mg | ORAL_CAPSULE | ORAL | 0 refills | Status: DC

## 2017-06-09 NOTE — Telephone Encounter (Signed)
Left message on machine for patient Rx ready for pick up 

## 2017-06-09 NOTE — Telephone Encounter (Signed)
Pt called to see if Rx is ready for pickup, call pt to advise

## 2017-06-25 ENCOUNTER — Ambulatory Visit (INDEPENDENT_AMBULATORY_CARE_PROVIDER_SITE_OTHER): Payer: BLUE CROSS/BLUE SHIELD | Admitting: Family Medicine

## 2017-06-25 ENCOUNTER — Encounter: Payer: Self-pay | Admitting: Family Medicine

## 2017-06-25 VITALS — BP 115/80 | HR 80 | Temp 97.9°F | Ht 67.0 in | Wt 228.6 lb

## 2017-06-25 DIAGNOSIS — Z0000 Encounter for general adult medical examination without abnormal findings: Secondary | ICD-10-CM

## 2017-06-25 MED ORDER — METHYLPHENIDATE HCL ER (LA) 30 MG PO CP24: 30.0000 mg | ORAL_CAPSULE | ORAL | 0 refills | Status: DC

## 2017-06-25 MED ORDER — METHYLPHENIDATE HCL ER (LA) 30 MG PO CP24
30.0000 mg | ORAL_CAPSULE | ORAL | 0 refills | Status: DC
Start: 2017-06-25 — End: 2017-10-12

## 2017-06-25 MED ORDER — METHYLPHENIDATE HCL ER (LA) 30 MG PO CP24: ORAL_CAPSULE | ORAL | 0 refills | Status: DC

## 2017-06-25 NOTE — Progress Notes (Signed)
  Subjective:     Patient ID: Joseph Pittman, male   DOB: [birth year], 32 y.o.   MRN: [id number]  HPI Patient seen for complete physical. He has history of traumatic brain injury several years ago and history of attention deficit disorder. On Ritalin for many years. This seems to be working well. They have no specific concerns today.  Exercising with some swimming and walking. He does some simple weights a few times per week. No recent falls. Tetanus up-to-date. We screened his chemistries and lipids last year and these were stable. He had remote history of DVT but no evidence for recurrence in many years now  Past Medical History:  Diagnosis Date  . ADD 05/25/2008  . ADD (attention deficit disorder with hyperactivity)   . ADD (attention deficit disorder)   . Brain injury (HCC) 2003  . Clotting disorder (HCC) 2008   dvt  . Personal history of traumatic brain injury 05/15/2009   Past Surgical History:  Procedure Laterality Date  . BRAIN SURGERY  2003   tramatic injury  . DVT     history of  . FOOT SURGERY  2010   right foot    reports that he has been smoking.  He has never used smokeless tobacco. He reports that he drinks alcohol. He reports that he does not use drugs. family history includes Diabetes in his father and other; Heart disease in his other; Hyperlipidemia in his father. Allergies  Allergen Reactions  . Sulfa Antibiotics      Review of Systems  Constitutional: Negative for activity change, appetite change, fatigue and fever.  HENT: Negative for congestion, ear pain and trouble swallowing.   Eyes: Negative for pain and visual disturbance.  Respiratory: Negative for cough, shortness of breath and wheezing.   Cardiovascular: Negative for chest pain and palpitations.  Gastrointestinal: Negative for abdominal distention, abdominal pain, blood in stool, constipation, diarrhea, nausea, rectal pain and vomiting.  Genitourinary: Negative for dysuria, hematuria and testicular  pain.  Musculoskeletal: Negative for arthralgias and joint swelling.  Skin: Negative for rash.  Neurological: Negative for dizziness, syncope and headaches.  Hematological: Negative for adenopathy.  Psychiatric/Behavioral: Negative for confusion and dysphoric mood.       Objective:   Physical Exam  Constitutional: He is oriented to person, place, and time. He appears well-developed and well-nourished.  HENT:  Right Ear: External ear normal.  Left Ear: External ear normal.  Mouth/Throat: Oropharynx is clear and moist.  Eyes: Pupils are equal, round, and reactive to light.  Neck: Neck supple. No thyromegaly present.  Cardiovascular: Normal rate and regular rhythm.  Pulmonary/Chest: Effort normal and breath sounds normal. No respiratory distress. He has no wheezes. He has no rales.  Musculoskeletal: He exhibits no edema.  Neurological: He is alert and oriented to person, place, and time.  Skin: No rash noted.       Assessment:     Physical exam. The following issues were addressed    Plan:     -Continue with regular exercise habits -Refill Ritalin for 3 months -No need for any further labs at this point. Consider follow-up labs in a year at physical  Kristian Covey MD [facility] at [facility]

## 2017-10-12 ENCOUNTER — Other Ambulatory Visit: Payer: Self-pay | Admitting: Family Medicine

## 2017-10-12 MED ORDER — METHYLPHENIDATE HCL ER (LA) 30 MG PO CP24: 30.0000 mg | ORAL_CAPSULE | ORAL | 0 refills | Status: DC

## 2017-10-12 MED ORDER — METHYLPHENIDATE HCL ER (LA) 30 MG PO CP24: ORAL_CAPSULE | ORAL | 0 refills | Status: DC

## 2017-10-12 NOTE — Telephone Encounter (Signed)
Last refill given at office visit 06/25/17. Okay to fill?

## 2017-10-12 NOTE — Telephone Encounter (Signed)
Copied from CRM [id number]. Topic: Quick Communication - Rx Refill/Question >> Oct 12, 2017  3:43 PM Percival Spanish wrote: Medication    methylphenidate (RITALIN LA) 30 MG 24 hr capsule   [facility] [place]   Agent: Please be advised that RX refills may take up to 3 business days. We ask that you follow-up with your pharmacy.

## 2017-10-12 NOTE — Telephone Encounter (Signed)
Refills sent

## 2018-01-19 ENCOUNTER — Other Ambulatory Visit: Payer: Self-pay | Admitting: Family Medicine

## 2018-01-19 NOTE — Telephone Encounter (Unsigned)
Copied from CRM [id number]. Topic: Quick Communication - Rx Refill/Question >> Jan 19, 2018 10:01 AM Waymon AmatoBurton, Donna F wrote: Medication: ritalin Has the patient contacted their pharmacy? Yes but pharmacy said to call office  (Agent: If no, request that the patient contact the pharmacy for the refill.) (Agent: If yes, when and what did the pharmacy advise?)  [facility]erred [facility] (with phone number [facility]  Agent: Please be advised that RX refills may take up to 3 business days. We ask that you follow-up with your pharmacy.  Best number [phone]

## 2018-01-21 ENCOUNTER — Telehealth: Payer: Self-pay

## 2018-01-21 MED ORDER — METHYLPHENIDATE HCL ER (LA) 30 MG PO CP24: 30.0000 mg | ORAL_CAPSULE | ORAL | 0 refills | Status: DC

## 2018-01-21 MED ORDER — METHYLPHENIDATE HCL ER (LA) 30 MG PO CP24: ORAL_CAPSULE | ORAL | 0 refills | Status: DC

## 2018-01-21 NOTE — Telephone Encounter (Signed)
Patient is up front and needs a print out of his prescription for Ritalin.   Last OV 06/25/17, No future OV  Can you print this out for him?

## 2018-01-21 NOTE — Telephone Encounter (Signed)
done

## 2018-04-20 ENCOUNTER — Other Ambulatory Visit: Payer: Self-pay

## 2018-04-20 ENCOUNTER — Telehealth: Payer: Self-pay

## 2018-04-20 MED ORDER — METHYLPHENIDATE HCL ER (LA) 30 MG PO CP24: ORAL_CAPSULE | ORAL | 0 refills | Status: DC

## 2018-04-20 MED ORDER — METHYLPHENIDATE HCL ER (LA) 30 MG PO CP24: 30.0000 mg | ORAL_CAPSULE | ORAL | 0 refills | Status: DC

## 2018-04-20 NOTE — Telephone Encounter (Signed)
Prescriptions have been printed and are in the filing cabinet up front ready for pick up.

## 2018-04-20 NOTE — Telephone Encounter (Signed)
Copied from CRM [id number]. Topic: General - Other >> Apr 20, 2018 11:42 AM Jaquita Rector A wrote: Reason for CRM: Patient called to request Rx for methylphenidate (RITALIN LA) 30 MG 24 hr capsule he will pick it up at the office.

## 2018-04-20 NOTE — Telephone Encounter (Signed)
Last OV 06/2017, No future OV  Please see message. OK to fill with note to come in for appointment for May?

## 2018-04-20 NOTE — Telephone Encounter (Signed)
OK 

## 2018-04-27 NOTE — Telephone Encounter (Signed)
Called patient and they stated that they have already picked up the prescription.

## 2018-07-22 ENCOUNTER — Other Ambulatory Visit: Payer: Self-pay

## 2018-07-22 ENCOUNTER — Emergency Department (HOSPITAL_COMMUNITY)
Admission: EM | Admit: 2018-07-22 | Discharge: 2018-07-22 | Disposition: A | Discharge status: Home / Self Care | Payer: BC Managed Care – PPO | Attending: Emergency Medicine | Admitting: Emergency Medicine

## 2018-07-22 ENCOUNTER — Encounter (HOSPITAL_COMMUNITY): Payer: Self-pay | Admitting: *Deleted

## 2018-07-22 DIAGNOSIS — Z5321 Procedure and treatment not carried out due to patient leaving prior to being seen by health care provider: Secondary | ICD-10-CM | POA: Diagnosis not present

## 2018-07-22 DIAGNOSIS — M79642 Pain in left hand: Secondary | ICD-10-CM | POA: Insufficient documentation

## 2018-07-22 NOTE — ED Notes (Signed)
PT went to [facility] exam room and refused to see PA after they signed up for him and were coming in to see him. Stated he didn't want to see doctor and only wanted hand cleaned.

## 2018-07-22 NOTE — ED Triage Notes (Signed)
Pt was getting out of his car and fell hitting his L hand, abrasion to knuckles. 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 Pt requesting to have his hand cleaned only, doesn't want xrays.

## 2018-07-26 ENCOUNTER — Telehealth: Payer: Self-pay | Admitting: Family Medicine

## 2018-07-26 NOTE — Telephone Encounter (Signed)
Pt needs refill on methylphenidate (RITALIN LA) 30 MG 24 hr capsule  [facility] Please call if pt needs to pick up

## 2018-07-26 NOTE — Telephone Encounter (Signed)
Last OV 06/25/17  Last filled 04/20/18, # 30 with 2 refills  I have set up a Doxy for tomorrow at 2:30 for patient for med refill.

## 2018-07-27 ENCOUNTER — Ambulatory Visit (INDEPENDENT_AMBULATORY_CARE_PROVIDER_SITE_OTHER): Payer: BC Managed Care – PPO | Admitting: Family Medicine

## 2018-07-27 ENCOUNTER — Other Ambulatory Visit: Payer: Self-pay

## 2018-07-27 DIAGNOSIS — F988 Other specified behavioral and emotional disorders with onset usually occurring in childhood and adolescence: Secondary | ICD-10-CM | POA: Diagnosis not present

## 2018-07-27 MED ORDER — METHYLPHENIDATE HCL ER (LA) 30 MG PO CP24: 30.0000 mg | ORAL_CAPSULE | ORAL | 0 refills | Status: DC

## 2018-07-27 MED ORDER — METHYLPHENIDATE HCL ER (LA) 30 MG PO CP24: ORAL_CAPSULE | ORAL | 0 refills | Status: DC
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
## 2018-10-31 ENCOUNTER — Other Ambulatory Visit: Payer: Self-pay | Admitting: Family Medicine

## 2018-10-31 NOTE — Telephone Encounter (Signed)
Medication refill: methylphenidate (RITALIN LA) 30 MG 24 hr capsule [[id number]]    [facility]  [id number] [place], [place] [phone] (Phone) [phone] (Fax)   Pt aware of turn around time

## 2018-10-31 NOTE — Telephone Encounter (Signed)
Requested medication (s) are due for refill today -yes  Requested medication (s) are on the active medication list -yes  Future visit scheduled -no  Last refill: 07/27/18  Notes to clinic: Patient is requesting refill of non delegated Rx- sent to PCP for review of request  Requested Prescriptions  Pending Prescriptions Disp Refills   methylphenidate (RITALIN LA) 30 MG 24 hr capsule 30 capsule 0    Sig: Take 1 capsule (30 mg total) by mouth every morning.     Not Delegated - Psychiatry:  Stimulants/ADHD Failed - 10/31/2018  2:46 PM      Failed - This refill cannot be delegated      Failed - Urine Drug Screen completed in last 360 days.      Failed - Valid encounter within last 3 months    Recent Outpatient Visits          3 months ago Attention deficit disorder, unspecified hyperactivity presence   [facility]rap[facility], [facility] at [facility], Alinda Sierras, MD   1 year ago Physical exam   [facility]rap[facility] at [facility], Alinda Sierras, MD   2 years ago Physical exam   Therapist, [facility] at [facility], Alinda Sierras, MD   3 years ago Personal history of traumatic brain injury   Therapist, music at [facility], Alinda Sierras, MD   3 years ago Attention deficit disorder   Therapist, music at [facility], Alinda Sierras, MD                Requested Prescriptions  Pending Prescriptions Disp Refills   methylphenidate (RITALIN LA) 30 MG 24 hr capsule 30 capsule 0    Sig: Take 1 capsule (30 mg total) by mouth every morning.     Not Delegated - Psychiatry:  Stimulants/ADHD Failed - 10/31/2018  2:46 PM      Failed - This refill cannot be delegated      Failed - Urine Drug Screen completed in last 360 days.      Failed - Valid encounter within last 3 months    Recent Outpatient Visits          3 months ago Attention deficit disorder, unspecified hyperactivity presence   Therapist, [facility] at [facility], Alinda Sierras, MD   1 year ago Physical exam   [place] [facility], Alinda Sierras, MD   2 years ago Physical exam   [place] at [facility], Alinda Sierras, MD   3 years ago Personal history of traumatic brain injury   Therapist, music at [facility], Alinda Sierras, MD   3 years ago Attention deficit disorder   Therapist, music at [facility], Alinda Sierras, MD

## 2018-11-01 MED ORDER — METHYLPHENIDATE HCL ER (LA) 30 MG PO CP24: 30.0000 mg | ORAL_CAPSULE | ORAL | 0 refills | Status: DC

## 2018-11-01 MED ORDER — METHYLPHENIDATE HCL ER (LA) 30 MG PO CP24: ORAL_CAPSULE | ORAL | 0 refills | Status: DC

## 2018-11-01 NOTE — Telephone Encounter (Signed)
Refilled Ritalin.

## 2018-11-01 NOTE — Telephone Encounter (Signed)
Last OV 07/27/18, No future OV  Last filled 07/27/18, # 30 with 2 refills

## 2018-12-29 ENCOUNTER — Other Ambulatory Visit: Payer: Self-pay

## 2018-12-29 DIAGNOSIS — Z20822 Contact with and (suspected) exposure to covid-19: Secondary | ICD-10-CM

## 2018-12-31 LAB — NOVEL CORONAVIRUS, NAA: SARS-CoV-2, NAA: NOT DETECTED

## 2019-02-06 ENCOUNTER — Telehealth: Payer: Self-pay | Admitting: Family Medicine

## 2019-02-06 MED ORDER — METHYLPHENIDATE HCL ER (LA) 30 MG PO CP24: 30.0000 mg | ORAL_CAPSULE | ORAL | 0 refills | Status: DC

## 2019-02-06 MED ORDER — METHYLPHENIDATE HCL ER (LA) 30 MG PO CP24: ORAL_CAPSULE | ORAL | 0 refills | Status: DC

## 2019-02-06 NOTE — Telephone Encounter (Signed)
methylphenidate (RITALIN LA) 30 MG 24 hr capsule     Patient is requesting refill.     [facility]  [id number] [place], [place] Phone:  [phone]  Fax:  [phone]

## 2019-02-07 NOTE — Telephone Encounter (Signed)
Prior Authorization has been started. Key # [id number]

## 2019-02-07 NOTE — Telephone Encounter (Signed)
Patient's mother states that pharmacy is needing authorization to fill this medication.

## 2019-02-15 NOTE — Telephone Encounter (Signed)
Appeal has been faxed for prior authorization today.

## 2019-02-17 NOTE — Telephone Encounter (Signed)
Patient has been approved effective 02/02/19 through 02/16/2020 per [facility] Case ID [id number] Patient ID [id number]

## 2019-03-23 ENCOUNTER — Telehealth: Payer: Self-pay | Admitting: Family Medicine

## 2019-03-23 NOTE — Telephone Encounter (Signed)
Patient's father wants to see if it is ok for pt to get the Covid vaccine with his traumatic brain injury?

## 2019-03-24 NOTE — Telephone Encounter (Signed)
Called father and gave him the message from Dr. Caryl Never and father verbalized an understanding.

## 2019-03-24 NOTE — Telephone Encounter (Signed)
Yes.  He should consider getting it.  I do not know of any contraindications based on previous traumatic brain injury

## 2019-05-03 ENCOUNTER — Other Ambulatory Visit: Payer: Self-pay | Admitting: Family Medicine

## 2019-05-03 MED ORDER — METHYLPHENIDATE HCL ER (LA) 30 MG PO CP24: ORAL_CAPSULE | ORAL | 0 refills | Status: DC

## 2019-05-03 MED ORDER — METHYLPHENIDATE HCL ER (LA) 30 MG PO CP24: 30.0000 mg | ORAL_CAPSULE | ORAL | 0 refills | Status: DC

## 2019-05-03 NOTE — Telephone Encounter (Signed)
Last ov:07/27/2018 Last filled:02/06/2019

## 2019-05-03 NOTE — Telephone Encounter (Signed)
Pt call need a refill on methylphenidate (RITALIN LA) sent to  [id number] [place], [place] - [address] Phone:  [phone]  Fax:  [phone]

## 2019-06-09 ENCOUNTER — Ambulatory Visit (INDEPENDENT_AMBULATORY_CARE_PROVIDER_SITE_OTHER): Payer: BC Managed Care – PPO | Admitting: Family Medicine

## 2019-06-09 ENCOUNTER — Other Ambulatory Visit: Payer: Self-pay

## 2019-06-09 DIAGNOSIS — H6693 Otitis media, unspecified, bilateral: Secondary | ICD-10-CM | POA: Diagnosis not present

## 2019-06-09 MED ORDER — AMOXICILLIN-POT CLAVULANATE 875-125 MG PO TABS: 1.0000 | ORAL_TABLET | Freq: Two times a day (BID) | ORAL | 0 refills | Status: DC

## 2019-06-09 NOTE — Progress Notes (Signed)
  Subjective:     Patient ID: Joseph Pittman, male   DOB: Dec 29, 1985, 34 y.o.   MRN: [id number]  HPI   Gala Romney had called with some bilateral earache.  For the past week.  He has history of traumatic brain injury and history is somewhat difficult with him.  We also discussed with his dad.  They thought there may be some cerumen impaction and got some type of over-the-counter wax remover and tried that followed by irrigation.  He complains of some mild bilateral earache but more than anything has sensation of "echo "in both ears.  No drainage.  No dizziness.  No major nasal congestion.  Initial chief complaint on computer indicated "bad cough ".  Gala Romney denies any cough this time and father states he has had minimal if any cough.  No fever.  This was initially scheduled as a virtual visit as there has been report of some coughing as above but this is not clear from the history I had gotten.  We ended up having them come to the office so that I could go examine them and they were seen out in the parking lot for ear exam  Past Medical History:  Diagnosis Date  . ADD 05/25/2008  . ADD (attention deficit disorder with hyperactivity)   . ADD (attention deficit disorder)   . Brain injury (HCC) 2003  . Clotting disorder (HCC) 2008   dvt  . Personal history of traumatic brain injury 05/15/2009   Past Surgical History:  Procedure Laterality Date  . BRAIN SURGERY  2003   tramatic injury  . DVT     history of  . FOOT SURGERY  2010   right foot    reports that he has been smoking. He has never used smokeless tobacco. He reports current alcohol use. He reports that he does not use drugs. family history includes Diabetes in his father and another family member; Heart disease in an other family member; Hyperlipidemia in his father. Allergies  Allergen Reactions  . Sulfa Antibiotics      Review of Systems  Constitutional: Negative for chills and fatigue.  HENT: Positive for congestion and ear pain.  Negative for ear discharge, hearing loss, sinus pressure and sinus pain.   Respiratory: Negative for shortness of breath.   Cardiovascular: Negative for chest pain.  Neurological: Negative for dizziness and headaches.       Objective:   Physical Exam Vitals reviewed.  Constitutional:      Appearance: Normal appearance.  HENT:     Head:     Comments: Right eardrum is moderately erythematous and somewhat retracted.  There is no cerumen in the canal.  Left eardrum is bulging with distortion of landmarks.  No obvious perforation.  No cerumen in the canal Cardiovascular:     Rate and Rhythm: Normal rate and regular rhythm.  Neurological:     Mental Status: He is alert.        Assessment:     Acute bilateral otitis media.  No cerumen impaction    Plan:     -Avoid further cerumen drops and especially avoid irrigation -Start Augmentin 875 mg twice daily with food -Office follow-up to reexamine ears in 2 weeks if symptoms not fully resolved  Kristian Covey MD [facility] at [facility]

## 2019-08-07 ENCOUNTER — Other Ambulatory Visit: Payer: Self-pay | Admitting: Family Medicine

## 2019-08-07 MED ORDER — METHYLPHENIDATE HCL ER (LA) 30 MG PO CP24: ORAL_CAPSULE | ORAL | 0 refills | Status: DC

## 2019-08-07 MED ORDER — METHYLPHENIDATE HCL ER (LA) 30 MG PO CP24: 30.0000 mg | ORAL_CAPSULE | ORAL | 0 refills | Status: DC

## 2019-08-07 NOTE — Addendum Note (Signed)
Addended by: Kristian Covey on: 08/07/2019 08:56 PM   Modules accepted: Orders

## 2019-08-07 NOTE — Addendum Note (Signed)
Addended by: Raiford Simmonds R on: 08/07/2019 02:37 PM   Modules accepted: Orders

## 2019-08-07 NOTE — Telephone Encounter (Signed)
Medication Refill:  Ritalin  Pharmacy:  [id number] - [place], [place] - [address] Phone:  [phone]  Fax:  [phone]

## 2019-11-03 ENCOUNTER — Telehealth: Payer: Self-pay | Admitting: Family Medicine

## 2019-11-03 NOTE — Telephone Encounter (Signed)
Pt call and need a refill on methylphenidate (RITALIN LA) 30 MG 24 hr capsule sent to  [id number] - [place], [place] - [address] Phone:  [phone]  Fax:  [phone]

## 2019-11-06 ENCOUNTER — Other Ambulatory Visit: Payer: Self-pay | Admitting: Family Medicine

## 2019-11-06 MED ORDER — METHYLPHENIDATE HCL ER (LA) 30 MG PO CP24: ORAL_CAPSULE | ORAL | 0 refills | Status: DC

## 2019-11-06 MED ORDER — METHYLPHENIDATE HCL ER (LA) 30 MG PO CP24: 30.0000 mg | ORAL_CAPSULE | ORAL | 0 refills | Status: DC

## 2020-02-08 ENCOUNTER — Telehealth: Payer: Self-pay | Admitting: Family Medicine

## 2020-02-08 MED ORDER — METHYLPHENIDATE HCL ER (LA) 30 MG PO CP24: 30.0000 mg | ORAL_CAPSULE | ORAL | 0 refills | Status: DC

## 2020-02-08 MED ORDER — METHYLPHENIDATE HCL ER (LA) 30 MG PO CP24: ORAL_CAPSULE | ORAL | 0 refills | Status: DC

## 2020-02-08 NOTE — Telephone Encounter (Signed)
Patient is calling and requesting a refill for methylphenidate (RITALIN LA) 30 MG 24 hr capsule sent to [id number] - [place], [place] - [address], [place] [address]  Phone:  [phone] Fax:  [phone]  CB is [phone]

## 2020-03-13 NOTE — Progress Notes (Signed)
Key: [id number] - PA Case ID: [id number] - Rx #: [id number] Need help? Call us at [phone] Outcome Approvedtoday [id number];YOVZCH:[id number];Review Type:Prior Auth;Coverage Start Date:02/12/2020;Coverage End Date:03/13/2021; Drug Methylphenidate HCl ER (LA) 30MG  er capsules Form [employer] Electronic PA Form (2017 NCPDP) Original Claim Info 75 CALL HELP DESKFOR EMRG OVD, SCC=13 PER [facility] DISCRETION

## 2020-05-13 ENCOUNTER — Telehealth: Payer: Self-pay | Admitting: Family Medicine

## 2020-05-13 MED ORDER — METHYLPHENIDATE HCL ER (LA) 30 MG PO CP24: 30.0000 mg | ORAL_CAPSULE | ORAL | 0 refills | Status: DC

## 2020-05-13 NOTE — Telephone Encounter (Signed)
Refilled once.  Needs office follow-up. °

## 2020-05-13 NOTE — Telephone Encounter (Signed)
Pt call and stated he need a refill on  methylphenidate (RITALIN LA) 30 MG 24 hr capsule sent to  [id number] - [place], [place] - [address] Phone:  [phone]  Fax:  [phone]

## 2020-05-13 NOTE — Telephone Encounter (Signed)
Last filled 01/12/11 Last OV 05/13/2019  Ok to fill?

## 2020-05-14 NOTE — Telephone Encounter (Signed)
Spoke with the patient, he is aware his refill has been sent in and that he will need an appointment for further refills.

## 2020-06-10 ENCOUNTER — Other Ambulatory Visit: Payer: Self-pay

## 2020-06-10 ENCOUNTER — Encounter: Payer: Self-pay | Admitting: Family Medicine

## 2020-06-10 ENCOUNTER — Ambulatory Visit (INDEPENDENT_AMBULATORY_CARE_PROVIDER_SITE_OTHER): Payer: BC Managed Care – PPO | Admitting: Family Medicine

## 2020-06-10 VITALS — BP 130/70 | HR 101 | Temp 97.5°F | Wt 232.2 lb

## 2020-06-10 DIAGNOSIS — Z86718 Personal history of other venous thrombosis and embolism: Secondary | ICD-10-CM | POA: Diagnosis not present

## 2020-06-10 DIAGNOSIS — F988 Other specified behavioral and emotional disorders with onset usually occurring in childhood and adolescence: Secondary | ICD-10-CM

## 2020-06-10 MED ORDER — METHYLPHENIDATE HCL ER (LA) 30 MG PO CP24: ORAL_CAPSULE | ORAL | 0 refills | Status: DC

## 2020-06-10 MED ORDER — METHYLPHENIDATE HCL ER (LA) 30 MG PO CP24: 30.0000 mg | ORAL_CAPSULE | ORAL | 0 refills | Status: DC

## 2020-06-10 NOTE — Progress Notes (Signed)
Established Patient Office Visit  Subjective:  Patient ID: Joseph Pittman, male    DOB: 08-14-1985  Age: 35 y.o. MRN: [id number]  CC:  Chief Complaint  Patient presents with  . Medication Refill    HPI Joseph Pittman presents for medical follow-up.  He has history of traumatic brain injury from his late teens and has some left-sided weakness and spasticity related to his injury.  He is able to ambulate without any assistance though he has high risk of falls because of his previous trauma.  He had remote history of DVT.  He has been on Ritalin now for several years.  Was placed on this initially by neurology several years ago.  Takes no other regular medications.  Does not get any regular exercise.  Does have family history of type 2 diabetes.  Weight relatively stable.  Denies any symptoms of polyuria or polydipsia.  He has a large common wart on his right middle finger.  This apparently has been treated with liquid nitrogen in the past.  At one point this went away but recently recurred.  Using over-the-counter Compound W without much improvement thus far.  Nonpainful.  Past Medical History:  Diagnosis Date  . ADD 05/25/2008  . ADD (attention deficit disorder with hyperactivity)   . ADD (attention deficit disorder)   . Brain injury (HCC) 2003  . Clotting disorder (HCC) 2008   dvt  . Personal history of traumatic brain injury 05/15/2009    Past Surgical History:  Procedure Laterality Date  . BRAIN SURGERY  2003   tramatic injury  . DVT     history of  . FOOT SURGERY  2010   right foot    Family History  Problem Relation Age of Onset  . Heart disease Other   . Diabetes Other   . Diabetes Father   . Hyperlipidemia Father     Social History   Socioeconomic History  . Marital status: Single    Spouse name: Not on file  . Number of children: Not on file  . Years of education: Not on file  . Highest education level: Not on file  Occupational History  . Not on file   Tobacco Use  . Smoking status: Current Some Day Smoker  . Smokeless tobacco: Never Used  Substance and Sexual Activity  . Alcohol use: Yes    Alcohol/week: 0.0 standard drinks  . Drug use: No  . Sexual activity: Not on file  Other Topics Concern  . Not on file  Social History Narrative  . Not on file   Social Determinants of Health   Financial Resource Strain: Not on file  Food Insecurity: Not on file  Transportation Needs: Not on file  Physical Activity: Not on file  Stress: Not on file  Social Connections: Not on file  Intimate Partner Violence: Not on file    Outpatient Medications Prior to Visit  Medication Sig Dispense Refill  . fish oil-omega-3 fatty acids 1000 MG capsule Take 1 g by mouth daily.    . Multiple Vitamins-Minerals (MENS 50+ MULTI VITAMIN/MIN PO) Take by mouth daily.    Marland Kitchen amoxicillin-clavulanate (AUGMENTIN) 875-125 MG tablet Take 1 tablet by mouth 2 (two) times daily. 20 tablet 0  . methylphenidate (RITALIN LA) 30 MG 24 hr capsule Take 1 capsule (30 mg total) by mouth every morning. May refill in two months. 30 capsule 0  . methylphenidate (RITALIN LA) 30 MG 24 hr capsule Take one capsule by mouth every  morning.  May refill in one month. 30 capsule 0  . methylphenidate (RITALIN LA) 30 MG 24 hr capsule Take 1 capsule (30 mg total) by mouth every morning. 30 capsule 0   No facility-administered medications prior to visit.    Allergies  Allergen Reactions  . Sulfa Antibiotics     ROS Review of Systems  Constitutional: Negative for appetite change and unexpected weight change.  Respiratory: Negative for shortness of breath.   Cardiovascular: Negative for chest pain.  Neurological: Negative for headaches.      Objective:    Physical Exam Vitals reviewed.  Cardiovascular:     Rate and Rhythm: Normal rate and regular rhythm.  Pulmonary:     Effort: Pulmonary effort is normal.     Breath sounds: Normal breath sounds.  Musculoskeletal:     Right  lower leg: No edema.     Left lower leg: No edema.  Skin:    Comments: Fairly large common wart right middle finger volar surface near the DIP joint.     BP 130/70 (BP Location: Left Arm, Patient Position: Sitting, Cuff Size: Normal)   Pulse (!) 101   Temp (!) 97.5 F (36.4 C) (Oral)   Wt 232 lb 3.2 oz (105.3 kg)   SpO2 97%   BMI 36.37 kg/m  Wt Readings from Last 3 Encounters:  06/10/20 232 lb 3.2 oz (105.3 kg)  06/25/17 228 lb 9.6 oz (103.7 kg)  06/22/16 221 lb 11.2 oz (100.6 kg)     Health Maintenance Due  Topic Date Due  . Hepatitis C Screening  Never done  . COVID-19 Vaccine (1) Never done  . HIV Screening  Never done    There are no preventive care reminders to display for this patient.  Lab Results  Component Value Date   TSH 2.29 03/15/2014   Lab Results  Component Value Date   WBC 10.5 03/15/2014   HGB 16.6 03/15/2014   HCT 49.0 03/15/2014   MCV 87.8 03/15/2014   PLT 274.0 03/15/2014   Lab Results  Component Value Date   NA 141 06/22/2016   K 4.2 06/22/2016   CO2 26 06/22/2016   GLUCOSE 91 06/22/2016   BUN 9 06/22/2016   CREATININE 0.70 06/22/2016   BILITOT 0.4 03/15/2014   ALKPHOS 93 03/15/2014   AST 14 03/15/2014   ALT 26 03/15/2014   PROT 6.9 03/15/2014   ALBUMIN 4.0 03/15/2014   CALCIUM 9.3 06/22/2016   GFR 138.22 06/22/2016   Lab Results  Component Value Date   CHOL 125 06/22/2016   Lab Results  Component Value Date   HDL 40.00 06/22/2016   Lab Results  Component Value Date   LDLCALC 72 06/22/2016   Lab Results  Component Value Date   TRIG 64.0 06/22/2016   Lab Results  Component Value Date   CHOLHDL 3 06/22/2016   No results found for: HGBA1C    Assessment & Plan:   #1 attention deficit disorder.  Stable. -Refill Ritalin for 3 months  #2 Common wart right middle finger.  Discussed possible treatment options.  We explained that usually these are very self-limited and at this point he decided to not pursue any further  treatments  Positive family history of type 2 diabetes.  Patient has increased BMI of 36.  We talked about prevention and have also recommended he schedule complete physical at some point this year and screen for diabetes with fasting glucose and consider A1c  Meds ordered this encounter  Medications  .  methylphenidate (RITALIN LA) 30 MG 24 hr capsule    Sig: Take 1 capsule (30 mg total) by mouth every morning.    Dispense:  30 capsule    Refill:  0  . methylphenidate (RITALIN LA) 30 MG 24 hr capsule    Sig: Take 1 capsule (30 mg total) by mouth every morning. May refill in two months.    Dispense:  30 capsule    Refill:  0  . methylphenidate (RITALIN LA) 30 MG 24 hr capsule    Sig: Take one capsule by mouth every morning.  May refill in one month.    Dispense:  30 capsule    Refill:  0    Follow-up: No follow-ups on file.    Evelena Peat, MD

## 2020-06-10 NOTE — Patient Instructions (Signed)
Consider setting up complete physical at some point this year.

## 2020-09-12 ENCOUNTER — Telehealth: Payer: Self-pay | Admitting: Family Medicine

## 2020-09-12 MED ORDER — METHYLPHENIDATE HCL ER (LA) 30 MG PO CP24: 30.0000 mg | ORAL_CAPSULE | ORAL | 0 refills | Status: DC

## 2020-09-12 MED ORDER — METHYLPHENIDATE HCL ER (LA) 30 MG PO CP24: ORAL_CAPSULE | ORAL | 0 refills | Status: DC

## 2020-09-12 NOTE — Addendum Note (Signed)
Addended by: Kristian Covey on: 09/12/2020 01:56 PM   Modules accepted: Orders

## 2020-09-12 NOTE — Telephone Encounter (Signed)
PT father called to request a refill of the PTs methylphenidate (RITALIN LA) 30 MG 24 hr capsule. PT father state the PT will be out on Monday and needs it asap or preferably called in Monday.

## 2020-09-12 NOTE — Telephone Encounter (Signed)
Last filled 06/10/2020 Last Ov 06/10/2020  Ok to fill?

## 2020-11-08 ENCOUNTER — Telehealth (INDEPENDENT_AMBULATORY_CARE_PROVIDER_SITE_OTHER): Payer: Self-pay | Admitting: Family Medicine

## 2020-11-08 ENCOUNTER — Emergency Department (HOSPITAL_COMMUNITY): Payer: BC Managed Care – PPO

## 2020-11-08 ENCOUNTER — Encounter: Payer: Self-pay | Admitting: Family Medicine

## 2020-11-08 ENCOUNTER — Emergency Department (HOSPITAL_COMMUNITY)
Admission: EM | Admit: 2020-11-08 | Discharge: 2020-11-08 | Disposition: A | Discharge status: Home / Self Care | Payer: BC Managed Care – PPO | Attending: Emergency Medicine | Admitting: Emergency Medicine

## 2020-11-08 ENCOUNTER — Encounter (HOSPITAL_COMMUNITY): Payer: Self-pay

## 2020-11-08 VITALS — Ht 67.0 in

## 2020-11-08 DIAGNOSIS — R059 Cough, unspecified: Secondary | ICD-10-CM | POA: Diagnosis not present

## 2020-11-08 DIAGNOSIS — Z5321 Procedure and treatment not carried out due to patient leaving prior to being seen by health care provider: Secondary | ICD-10-CM | POA: Diagnosis not present

## 2020-11-08 DIAGNOSIS — Z5329 Procedure and treatment not carried out because of patient's decision for other reasons: Secondary | ICD-10-CM

## 2020-11-08 NOTE — ED Triage Notes (Signed)
Pt arrived via POV, c/o productive cough x3 days. Denies any other sx. Denies any SOB at this time.

## 2020-11-08 NOTE — Progress Notes (Signed)
Spoke with mother and states that Joseph Pittman went to the ER. He was instructed to follow with PCP next week. Laron Angelini [place], MD

## 2020-12-16 ENCOUNTER — Telehealth: Payer: Self-pay

## 2020-12-16 NOTE — Telephone Encounter (Signed)
Patient called requesting Rx refill  methylphenidate (RITALIN LA) 30 MG 24 hr capsule

## 2020-12-16 NOTE — Telephone Encounter (Signed)
Last filled 09/12/2020 Last OV 11/08/2020  Ok to fill?

## 2020-12-17 ENCOUNTER — Telehealth: Payer: Self-pay

## 2020-12-17 MED ORDER — METHYLPHENIDATE HCL ER (LA) 30 MG PO CP24: ORAL_CAPSULE | ORAL | 0 refills | Status: DC

## 2020-12-17 MED ORDER — METHYLPHENIDATE HCL ER (LA) 30 MG PO CP24: 30.0000 mg | ORAL_CAPSULE | ORAL | 0 refills | Status: DC

## 2020-12-17 MED ORDER — METHYLPHENIDATE HCL ER (LA) 30 MG PO CP24
30.0000 mg | ORAL_CAPSULE | ORAL | 0 refills | Status: DC
Start: 2020-12-17 — End: 2021-03-13

## 2020-12-17 NOTE — Addendum Note (Signed)
Addended by: Kristian Covey on: 12/17/2020 05:53 AM   Modules accepted: Orders

## 2020-12-17 NOTE — Telephone Encounter (Signed)
Spoke with the patients pharmacy. They stated that his medication is already ready to be picked up. Will call the patient after 1.

## 2020-12-17 NOTE — Telephone Encounter (Signed)
Spoke with the patient. He is aware his medication is ready to be picked up. Nothing further needed.

## 2020-12-17 NOTE — Telephone Encounter (Signed)
Patient called back stating that the pharmacy won't refill Rx until 12/5 patient would like CMA to call the pharmacy to find out why Rx will not be released until then  methylphenidate (RITALIN LA) 30 MG 24 hr capsule Patient stated he would call back after lunch @1pm 

## 2021-03-13 ENCOUNTER — Telehealth: Payer: Self-pay | Admitting: Family Medicine

## 2021-03-13 MED ORDER — METHYLPHENIDATE HCL ER (LA) 30 MG PO CP24: ORAL_CAPSULE | ORAL | 0 refills | Status: DC

## 2021-03-13 MED ORDER — METHYLPHENIDATE HCL ER (LA) 30 MG PO CP24: 30.0000 mg | ORAL_CAPSULE | ORAL | 0 refills | Status: DC

## 2021-03-13 NOTE — Telephone Encounter (Signed)
Patient is calling in to request a refill for methylphenidate (RITALIN LA) 30 MG 24 hr capsule [[id number]]  to be sent to his pharmacy.  Patient could be contacted at [phone].  Please advise.

## 2021-03-13 NOTE — Telephone Encounter (Signed)
Last filled 12/17/2020 Last OV 11/08/2020  Ok to fill?

## 2021-06-20 ENCOUNTER — Other Ambulatory Visit: Payer: Self-pay | Admitting: Family Medicine

## 2021-06-20 NOTE — Telephone Encounter (Signed)
Pt 's mother called and stated he need a refill on methylphenidate (RITALIN LA) 30 MG 24 hr capsule she stated he is out of the medication and want it sent to  ?[id number] [place],  - [address] Phone:  [phone]  ?Fax:  [phone]  ?  ? ?

## 2021-06-21 MED ORDER — METHYLPHENIDATE HCL ER (LA) 30 MG PO CP24: ORAL_CAPSULE | ORAL | 0 refills | Status: DC

## 2021-06-21 NOTE — Telephone Encounter (Signed)
Refill sent for one month.    Needs office follow up before further refills.  ?

## 2021-07-16 ENCOUNTER — Ambulatory Visit: Payer: BC Managed Care – PPO | Admitting: Family Medicine

## 2021-07-25 ENCOUNTER — Encounter: Payer: Self-pay | Admitting: Family Medicine

## 2021-07-25 ENCOUNTER — Ambulatory Visit: Payer: Medicaid Other | Admitting: Family Medicine

## 2021-07-25 VITALS — BP 118/60 | HR 88 | Temp 98.1°F | Ht 67.0 in | Wt 216.5 lb

## 2021-07-25 DIAGNOSIS — F988 Other specified behavioral and emotional disorders with onset usually occurring in childhood and adolescence: Secondary | ICD-10-CM

## 2021-07-25 MED ORDER — METHYLPHENIDATE HCL ER (LA) 30 MG PO CP24: ORAL_CAPSULE | ORAL | 0 refills | Status: DC

## 2021-07-25 MED ORDER — METHYLPHENIDATE HCL ER (LA) 30 MG PO CP24: 30.0000 mg | ORAL_CAPSULE | ORAL | 0 refills | Status: DC

## 2021-07-25 NOTE — Progress Notes (Signed)
Established Patient Office Visit  Subjective   Patient ID: Joseph Pittman, male    DOB: 12-25-1985  Age: 36 y.o. MRN: [id number]  Chief Complaint  Patient presents with   Medication Consultation    HPI   Joseph Pittman is seen for follow-up regarding ADD.  He is on Ritalin LA 30 mg daily and has been on this for many years.  He does have history of attention deficit disorder.  He was actually placed on this following his traumatic brain injury several years ago.  He feels like he is more functional and taking this.  Denies any side effects.  He has no specific complaints today.  He continues to see orthopedic specialist in [place] regarding his spasticity from his injury.  Ambulates without assistance but does have increased risk of falls.  Past Medical History:  Diagnosis Date   ADD 05/25/2008   ADD (attention deficit disorder with hyperactivity)    ADD (attention deficit disorder)    Brain injury (HCC) 2003   Clotting disorder (HCC) 2008   dvt   Personal history of traumatic brain injury 05/15/2009   Past Surgical History:  Procedure Laterality Date   BRAIN SURGERY  2003   tramatic injury   DVT     history of   FOOT SURGERY  2010   right foot    reports that he has been smoking. He has never used smokeless tobacco. He reports current alcohol use. He reports that he does not use drugs. family history includes Diabetes in his father and another family member; Heart disease in an other family member; Hyperlipidemia in his father. Allergies  Allergen Reactions   Sulfa Antibiotics     Review of Systems  Constitutional:  Negative for chills, fever and weight loss.  Respiratory:  Negative for shortness of breath.   Cardiovascular:  Negative for chest pain.  Neurological:  Negative for headaches.      Objective:     BP 118/60 (BP Location: Right Arm, Cuff Size: Normal)   Pulse 88   Temp 98.1 F (36.7 C) (Oral)   Ht 5\' 7"  (1.702 m)   Wt 216 lb 8 oz (98.2 kg)   SpO2 98%    BMI 33.91 kg/m  BP Readings from Last 3 Encounters:  07/25/21 118/60  11/08/20 (!) 166/100  06/10/20 130/70   Wt Readings from Last 3 Encounters:  07/25/21 216 lb 8 oz (98.2 kg)  11/08/20 230 lb (104.3 kg)  06/10/20 232 lb 3.2 oz (105.3 kg)      Physical Exam Vitals reviewed.  Constitutional:      Appearance: Normal appearance.  Cardiovascular:     Rate and Rhythm: Normal rate and regular rhythm.  Pulmonary:     Effort: Pulmonary effort is normal.     Breath sounds: Normal breath sounds. No wheezing or rales.  Musculoskeletal:     Right lower leg: No edema.     Left lower leg: No edema.  Neurological:     Mental Status: He is alert.      No results found for any visits on 07/25/21.    The ASCVD Risk score (Arnett DK, et al., 2019) failed to calculate for the following reasons:   The 2019 ASCVD risk score is only valid for ages 59 to 4    Assessment & Plan:   Problem List Items Addressed This Visit       Unprioritized   Attention deficit disorder - Primary  Stable.  Refill Ritalin 30 mg  once daily for 3 months.  His initial blood pressure today was slightly elevated but repeat after rest was well controlled.  -We have encouraged him to stay very active physically with regular exercise and to work on weight control -Also encouraged to consider complete physical at some point this year  No follow-ups on file.    Evelena Peat, MD

## 2021-07-28 ENCOUNTER — Telehealth: Payer: Self-pay | Admitting: Family Medicine

## 2021-07-28 MED ORDER — METHYLPHENIDATE HCL ER (LA) 30 MG PO CP24
ORAL_CAPSULE | ORAL | 0 refills | Status: DC
Start: 2021-07-28 — End: 2021-11-10

## 2021-07-28 MED ORDER — METHYLPHENIDATE HCL ER (LA) 30 MG PO CP24: 30.0000 mg | ORAL_CAPSULE | ORAL | 0 refills | Status: DC

## 2021-07-28 MED ORDER — METHYLPHENIDATE HCL ER (LA) 30 MG PO CP24
30.0000 mg | ORAL_CAPSULE | ORAL | 0 refills | Status: DC
Start: 2021-07-28 — End: 2021-11-10

## 2021-07-28 NOTE — Telephone Encounter (Signed)
Patient's mother stopped by because [facility] [place] [facility] not [facility] amount for methylphenidate (RITALIN LA) 30 MG 24 hr capsule in stock, but [facility] [place] [facility] [address] [facility] the full amount and patient needs the prescription transferred over there.    [facility]   [facility] [address], [place], [place] [address]      Please advise

## 2021-07-28 NOTE — Telephone Encounter (Signed)
sent 

## 2021-07-28 NOTE — Telephone Encounter (Signed)
Noted  

## 2021-11-10 ENCOUNTER — Other Ambulatory Visit: Payer: Self-pay | Admitting: Family Medicine

## 2021-11-10 MED ORDER — METHYLPHENIDATE HCL ER (LA) 30 MG PO CP24
30.0000 mg | ORAL_CAPSULE | ORAL | 0 refills | Status: DC
Start: 2021-11-10 — End: 2022-01-12

## 2021-11-10 MED ORDER — METHYLPHENIDATE HCL ER (LA) 30 MG PO CP24
ORAL_CAPSULE | ORAL | 0 refills | Status: DC
Start: 2021-11-10 — End: 2022-02-10

## 2021-11-10 MED ORDER — METHYLPHENIDATE HCL ER (LA) 30 MG PO CP24
30.0000 mg | ORAL_CAPSULE | ORAL | 0 refills | Status: DC
Start: 2021-11-10 — End: 2022-02-10

## 2021-11-10 NOTE — Telephone Encounter (Signed)
Pt mom is calling and pt needs a refill on methylphenidate (RITALIN LA) 30 MG 24 hr capsule  [id number] [place], [place] - [address] Phone: [phone]  Fax: [phone]     

## 2022-01-12 ENCOUNTER — Other Ambulatory Visit: Payer: Self-pay | Admitting: Family Medicine

## 2022-01-12 MED ORDER — METHYLPHENIDATE HCL ER (LA) 30 MG PO CP24
30.0000 mg | ORAL_CAPSULE | ORAL | 0 refills | Status: DC
Start: 2022-01-12 — End: 2022-02-10

## 2022-01-12 NOTE — Progress Notes (Signed)
Pt's pharmacy out of Ritalin.   His mom requesting rx from [facility] [place] which does [facility] it  Kristian Covey MD [facility] at [facility]

## 2022-02-10 ENCOUNTER — Telehealth: Payer: Self-pay | Admitting: Family Medicine

## 2022-02-10 MED ORDER — METHYLPHENIDATE HCL ER (LA) 30 MG PO CP24
30.0000 mg | ORAL_CAPSULE | ORAL | 0 refills | Status: DC
Start: 2022-02-10 — End: 2022-04-22

## 2022-02-10 MED ORDER — METHYLPHENIDATE HCL ER (LA) 30 MG PO CP24
ORAL_CAPSULE | ORAL | 0 refills | Status: DC
Start: 2022-02-10 — End: 2022-04-22

## 2022-02-10 NOTE — Telephone Encounter (Signed)
Pt mom is calling and pt needs a refill on methylphenidate (RITALIN LA) 30 MG 24 hr capsule  [id number] [place], [place] - [address] Phone: [phone]  Fax: [phone]

## 2022-02-10 NOTE — Telephone Encounter (Signed)
Noted  

## 2022-03-11 ENCOUNTER — Other Ambulatory Visit: Payer: Self-pay

## 2022-03-11 ENCOUNTER — Emergency Department (HOSPITAL_COMMUNITY): Payer: Medicaid Other

## 2022-03-11 ENCOUNTER — Encounter (HOSPITAL_COMMUNITY): Payer: Self-pay | Admitting: Emergency Medicine

## 2022-03-11 ENCOUNTER — Emergency Department (HOSPITAL_COMMUNITY)
Admission: EM | Admit: 2022-03-11 | Discharge: 2022-03-12 | Disposition: A | Discharge status: Home / Self Care | Payer: Medicaid Other | Attending: Emergency Medicine | Admitting: Emergency Medicine

## 2022-03-11 DIAGNOSIS — S0101XA Laceration without foreign body of scalp, initial encounter: Secondary | ICD-10-CM | POA: Insufficient documentation

## 2022-03-11 DIAGNOSIS — Y92015 Private garage of single-family (private) house as the place of occurrence of the external cause: Secondary | ICD-10-CM | POA: Insufficient documentation

## 2022-03-11 DIAGNOSIS — W19XXXA Unspecified fall, initial encounter: Secondary | ICD-10-CM

## 2022-03-11 DIAGNOSIS — S0181XA Laceration without foreign body of other part of head, initial encounter: Secondary | ICD-10-CM | POA: Diagnosis present

## 2022-03-11 DIAGNOSIS — W01198A Fall on same level from slipping, tripping and stumbling with subsequent striking against other object, initial encounter: Secondary | ICD-10-CM | POA: Insufficient documentation

## 2022-03-11 NOTE — ED Provider Triage Note (Addendum)
Emergency Medicine Provider Triage Evaluation Note  Joseph Pittman , a 37 y.o. male  was evaluated in triage.  Pt complains of falling and hitting his head on a wheelbarrow in the garage.  Denies LOC.  He has left side deficit at baseline due to prior TBI.  Patient has laceration to L side of head with bleeding controlled.   Review of Systems  Positive: As above Negative: As above  Physical Exam  BP (!) 157/110   Pulse (!) 102   Temp 98.4 F (36.9 C) (Oral)   Resp 16   SpO2 97%  Gen:   Awake, no distress   Resp:  Normal effort  MSK:   Moves extremities without difficulty  Other:  L side of scalp with 2 lacerations, minimal bleeding, both appear to be approximately 1 cm in length   Medical Decision Making  Medically screening exam initiated at 10:55 PM.  Appropriate orders placed.  GERALDINE TESAR was informed that the remainder of the evaluation will be completed by another provider, this initial triage assessment does not replace that evaluation, and the importance of remaining in the ED until their evaluation is complete.  Patient and his mother do not want CT scans at this time.  Patient is also apprehensive about laceration repair with staples.    Theressa Stamps R, [place] 03/11/22 2310    Theressa Stamps R, [place] 03/11/22 2312

## 2022-03-11 NOTE — ED Triage Notes (Signed)
Pt reports falling and hitting head on wheelbarrow in garage. Pt denies loss of consciousness. Pt has left side deficit at baseline due to hx of TBI. Pt has laceration to left side head with bleeding controlled.

## 2022-03-11 NOTE — ED Notes (Signed)
Urine specimen obtained, placed at bedside if needed. 

## 2022-03-12 NOTE — ED Provider Notes (Signed)
[facility] Provider Note   CSN: [id number] Arrival date & time: 03/11/22  2214     History  Chief Complaint  Patient presents with   Joseph Pittman is a 37 y.o. male.  HPI   Patient with medical history including traumatic brain injury from [facility], presents after a fall.  Patient states that he was outside in his garage tripped over a box of tools causing him to fall and hit his head on the left side.  He denies loss of conscious, he is not on anticoag.  States he was able to get himself up off the ground, and go back inside, he is not endorsing any headaches change in vision paresthesias or weakness the upper or lower extremities  denies any neck pain back pain chest pain pain in the upper or lower extremities no pain in the hips.  Mother is at bedside she is able to validate the story.  Home Medications Prior to Admission medications   Medication Sig Start Date End Date Taking? Authorizing Provider  fish oil-omega-3 fatty acids 1000 MG capsule Take 1 g by mouth daily.    [provider]  methylphenidate (RITALIN LA) 30 MG 24 hr capsule Take 1 capsule (30 mg total) by mouth every morning. 02/10/22   Burchette, Alinda Sierras, MD  methylphenidate (RITALIN LA) 30 MG 24 hr capsule Take one capsule by mouth every morning.  May refill in one month. 02/10/22   Burchette, Alinda Sierras, MD  methylphenidate (RITALIN LA) 30 MG 24 hr capsule Take 1 capsule (30 mg total) by mouth every morning. May refill in two months. 02/10/22   Burchette, Alinda Sierras, MD  Multiple Vitamins-Minerals (MENS 50+ MULTI VITAMIN/MIN PO) Take by mouth daily.    [provider]      Allergies    Sulfa antibiotics    Review of Systems   Review of Systems  Constitutional:  Negative for chills and fever.  Respiratory:  Negative for shortness of breath.   Cardiovascular:  Negative for chest pain.  Gastrointestinal:  Negative for abdominal pain.  Neurological:   Negative for headaches.    Physical Exam Updated Vital Signs BP (!) 150/92   Pulse 98   Temp 98.5 F (36.9 C) (Oral)   Resp 18   SpO2 100%  Physical Exam Vitals and nursing note reviewed.  Constitutional:      General: He is not in acute distress.    Appearance: He is not ill-appearing.  HENT:     Head: Normocephalic and atraumatic.     Comments: Patient has a small star-shaped laceration on his left parietal lobe, measures approximately 2 cm in length about 2 to 3 mm in depth hemodynamically stable.  There is no raccoon eyes or Battle sign noted.    Nose: No congestion.     Mouth/Throat:     Mouth: Mucous membranes are moist.     Pharynx: Oropharynx is clear.     Comments: No trismus no torticollis no oral trauma Eyes:     Extraocular Movements: Extraocular movements intact.     Conjunctiva/sclera: Conjunctivae normal.     Pupils: Pupils are equal, round, and reactive to light.  Cardiovascular:     Rate and Rhythm: Normal rate and regular rhythm.     Pulses: Normal pulses.     Heart sounds: No murmur heard.    No friction rub. No gallop.  Pulmonary:     Effort: No  respiratory distress.     Breath sounds: No wheezing, rhonchi or rales.  Abdominal:     Palpations: Abdomen is soft.     Tenderness: There is no abdominal tenderness. There is no right CVA tenderness or left CVA tenderness.  Musculoskeletal:     Comments: Spine was palpated was nontender to palpation no step-off or deformities noted, no pelvis instability no leg shortening, moving his upper and lower extremities without difficulty.  All of which were nontender.  Left arm is rigid and flexed at the elbow, this is stable for the patient from his TBI.  Skin:    General: Skin is warm and dry.  Neurological:     Mental Status: He is alert.     Comments: No facial asymmetry no difficulty with word finding following two-step commands there is no unilateral weakness present.  Psychiatric:        Mood and Affect: Mood  normal.     ED Results / Procedures / Treatments   Labs (all labs ordered are listed, but only abnormal results are displayed) Labs Reviewed - No data to display  EKG None  Radiology No results found.  Procedures .Marland KitchenLaceration Repair  Date/Time: 03/12/2022 12:13 AM  Performed by: Marcello Fennel, PA-C Authorized by: Marcello Fennel, PA-C   Consent:    Consent obtained:  Verbal   Consent given by:  Patient   Risks, benefits, and alternatives were discussed: yes     Risks discussed:  Infection, pain, retained foreign body, tendon damage, vascular damage, poor cosmetic result, poor wound healing, need for additional repair and nerve damage   Alternatives discussed:  No treatment Universal protocol:    Patient identity confirmed:  Verbally with patient Anesthesia:    Anesthesia method:  None Laceration details:    Location:  Scalp   Scalp location:  L parietal   Length (cm):  2   Depth (mm):  2 Pre-procedure details:    Preparation:  Patient was prepped and draped in usual sterile fashion Exploration:    Limited defect created (wound extended): no     Wound exploration: wound explored through full range of motion and entire depth of wound visualized     Contaminated: no   Treatment:    Area cleansed with:  Saline   Irrigation method:  Pressure wash   Visualized foreign bodies/material removed: no   Skin repair:    Repair method:  Staples   Number of staples:  1 Approximation:    Approximation:  Close Repair type:    Repair type:  Simple Post-procedure details:    Procedure completion:  Tolerated well, no immediate complications     Medications Ordered in ED Medications - No data to display  ED Course/ Medical Decision Making/ A&P                             Medical Decision Making Amount and/or Complexity of Data Reviewed Radiology: ordered.   This patient presents to the ED for concern of fall, this involves an extensive number of treatment  options, and is a complaint that carries with it a high risk of complications and morbidity.  The differential diagnosis includes intracranial bleed, thoracic/abdominal trauma, orthopedic injury    Additional history obtained:  Additional history obtained from mother at bedside External records from outside source obtained and reviewed including ophthalmology notes   Co morbidities that complicate the patient evaluation  Traumatic brain injury  Social  Determinants of Health:  N/A    Lab Tests:  I Ordered, and personally interpreted labs.  The pertinent results include: N/A   Imaging Studies ordered:  I ordered imaging studies including N/A I independently visualized and interpreted imaging which showed N/A I agree with the radiologist interpretation   Cardiac Monitoring:  The patient was maintained on a cardiac monitor.  I personally viewed and interpreted the cardiac monitored which showed an underlying rhythm of: N/A   Medicines ordered and prescription drug management:  I ordered medication including N/A I have reviewed the patients home medicines and have made adjustments as needed  Critical Interventions:  N/A   Reevaluation:  Presents after a fall, patient has a noted laceration on the left parietal lobe, I recommend staples for improved wound healing as well as a CT scan for further evaluation.  Patient tolerated the staple repair will await CT imaging  Notified by nursing that the patient would like to leave does not wait for the scan, I reassessed the patient, states that he gets nervous while being in the ER and he would rather go home, I explained that I cannot fully rule intracranial bleed but my suspicions are low since he is not on any blood thinners and there is no loss of consciousness, no focal deficits, patient is at his baseline per mother who is at bedside.  Explained that they may come back if symptoms worsening and are agreeable this plan and  ready for discharge.  Consultations Obtained:  N/a    Test Considered:  Ct head    Rule out low suspicion for intracranial head bleed as patient denies loss of conscious, is not on anticoagulant, she does not endorse headaches, paresthesia/weakness in the upper and lower extremities, no new focal deficits present on my exam.  Low suspicion for spinal cord abnormality or spinal fracture spine was palpated was nontender to palpation, patient has full range of motion in the upper and lower extremities.  I doubt thoracic/direct trauma both areas are nontender to palpation there is no evidence of trauma during my examination.     Dispostion and problem list  After consideration of the diagnostic results and the patients response to treatment, I feel that the patent would benefit from discharge.  Head laceration-patient received 1 stable, will defer on antibiotics as wound was thoroughly cleaned, wound was noncontaminated, will have him follow-up in 10 days time for suture removal.            Final Clinical Impression(s) / ED Diagnoses Final diagnoses:  Fall, initial encounter    Rx / DC Orders ED Discharge Orders     None         Marcello Fennel, PA-C 03/12/22 0023    Merryl Hacker, MD 03/17/22 [phone]

## 2022-03-12 NOTE — Discharge Instructions (Signed)
You have a small laceration on your head, I have placed a staple in your head , please refrain from getting it wet for the first 24 hours, tomorrow you may go ahead and rinse off the area, use over-the-counter pain medication as needed.  Please follow-up next 10 days for removal you may go to your primary care doctor, this emergency department or urgent care.  I would like to come back to the emergency department if you develop a serve headache, worsening numbness or weakness in the upper or lower extremities, unable walk, have uncontrolled nausea or vomiting.

## 2022-03-23 ENCOUNTER — Ambulatory Visit (INDEPENDENT_AMBULATORY_CARE_PROVIDER_SITE_OTHER): Payer: Medicaid Other | Admitting: Family Medicine

## 2022-03-23 VITALS — BP 110/60 | HR 110 | Temp 98.1°F | Ht 67.0 in | Wt 228.3 lb

## 2022-03-23 DIAGNOSIS — S0101XD Laceration without foreign body of scalp, subsequent encounter: Secondary | ICD-10-CM | POA: Diagnosis not present

## 2022-03-23 NOTE — Progress Notes (Signed)
   Established Patient Office Visit  Subjective   Patient ID: DAJOUR PIERPOINT, male    DOB: November 22, 1985  Age: 37 y.o. MRN: [id number]  Chief Complaint  Patient presents with   Hospitalization Follow-up    HPI   Joseph Pittman is seen for recent ER visit for laceration left parietal scalp.  He was walking in his garage and apparently tripped over a wheelbarrow on 31 January.  There was no loss of consciousness.  He does not take any blood thinners.  No headaches.  No dizziness.  No other injuries reported.  History multiple years ago of brain trauma.  He had 1 staple placed.  He had no complications since then.  Past Medical History:  Diagnosis Date   ADD 05/25/2008   ADD (attention deficit disorder with hyperactivity)    ADD (attention deficit disorder)    Brain injury ([place]) 2003   Clotting disorder ([place]) 2008   dvt   Personal history of traumatic brain injury 05/15/2009   Past Surgical History:  Procedure Laterality Date   BRAIN SURGERY  2003   tramatic injury   DVT     history of   FOOT SURGERY  2010   right foot    reports that he has been smoking. He has never used smokeless tobacco. He reports current alcohol use. He reports that he does not use drugs. family history includes Diabetes in his father and another family member; Heart disease in an other family member; Hyperlipidemia in his father. Allergies  Allergen Reactions   Sulfa Antibiotics     Review of Systems  Constitutional:  Negative for chills and fever.  Neurological:  Negative for dizziness, focal weakness and headaches.      Objective:     BP 110/60 (BP Location: Left Arm, Patient Position: Sitting, Cuff Size: Normal)   Pulse (!) 110   Temp 98.1 F (36.7 C) (Oral)   Ht 5\' 7"  (1.702 m)   Wt 228 lb 4.8 oz (103.6 kg)   SpO2 97%   BMI 35.76 kg/m    Physical Exam Vitals reviewed.  Constitutional:      Appearance: Normal appearance.  HENT:     Head:     Comments: Left parietal scalp reveals small eschar  which is about 2 cm in length.  He has 1 staple in place near the center.  This was easily removed.  There is no surrounding erythema.  No fluctuance. Cardiovascular:     Rate and Rhythm: Normal rate and regular rhythm.  Neurological:     Mental Status: He is alert.      No results found for any visits on 03/23/22.    The ASCVD Risk score (Arnett DK, et al., 2019) failed to calculate for the following reasons:   The 2019 ASCVD risk score is only valid for ages 59 to 63    Assessment & Plan:   Laceration left parietal scalp.  Staple removed without difficulty.  No signs of secondary affection.  Continue to clean daily with soap and water.  Follow-up as needed.  He denies any persistent headaches or other worrisome symptoms.   Carolann Littler, MD

## 2022-03-23 NOTE — Patient Instructions (Signed)
Keep scalp clean with soap and water  Looks like this is healing well.

## 2022-04-22 ENCOUNTER — Telehealth: Payer: Self-pay | Admitting: Family Medicine

## 2022-04-22 MED ORDER — METHYLPHENIDATE HCL ER (LA) 30 MG PO CP24: 30.0000 mg | ORAL_CAPSULE | ORAL | 0 refills | Status: DC

## 2022-04-22 MED ORDER — METHYLPHENIDATE HCL ER (LA) 30 MG PO CP24: ORAL_CAPSULE | ORAL | 0 refills | Status: DC

## 2022-04-22 NOTE — Telephone Encounter (Signed)
Prescription Request  04/22/2022  LOV: 03/23/2022  What is the name of the medication or equipment? methylphenidate (RITALIN LA) 30 MG 24 hr capsule   Have you contacted your pharmacy to request a refill? No   Which pharmacy would you like this sent to?  [facility] [id number] - [place], [place] - [address] Phone: [phone]  Fax: [phone]        Patient notified that their request is being sent to the clinical staff for review and that they should receive a response within 2 business days.   Please advise at [facility] [phone]

## 2022-04-23 MED ORDER — METHYLPHENIDATE HCL ER (LA) 30 MG PO CP24: 30.0000 mg | ORAL_CAPSULE | ORAL | 0 refills | Status: DC

## 2022-04-23 MED ORDER — METHYLPHENIDATE HCL ER (LA) 30 MG PO CP24: ORAL_CAPSULE | ORAL | 0 refills | Status: DC

## 2022-04-23 NOTE — Addendum Note (Signed)
Addended by: Eulas Post on: 04/23/2022 12:58 PM   Modules accepted: Orders

## 2022-04-23 NOTE — Telephone Encounter (Signed)
I spoke with Joseph Pittman at [facility] and rx has been cancelled

## 2022-04-23 NOTE — Telephone Encounter (Signed)
Patient's mother calling to check on progress of this refill request. Aware that provider is out today, asks if another provider would fill. asking for a call to confirm

## 2022-06-22 ENCOUNTER — Other Ambulatory Visit: Payer: Self-pay | Admitting: Family Medicine

## 2022-06-22 MED ORDER — METHYLPHENIDATE HCL ER (LA) 30 MG PO CP24: 30.0000 mg | ORAL_CAPSULE | ORAL | 0 refills | Status: DC

## 2022-06-22 NOTE — Telephone Encounter (Signed)
Prescription Request  06/22/2022  LOV: 03/23/2022  What is the name of the medication or equipment? methylphenidate (RITALIN LA) 30 MG 24 hr capsule   Have you contacted your pharmacy to request a refill? No   Which pharmacy would you like this sent to?   [facility] #[id number] - [place], [place] - [address] [place] [address] Phone: [phone] Fax: [phone]    Patient notified that their request is being sent to the clinical staff for review and that they should receive a response within 2 business days.   Please advise at Mobile [phone] (mobile)

## 2022-06-23 MED ORDER — METHYLPHENIDATE HCL ER (LA) 30 MG PO CP24: 30.0000 mg | ORAL_CAPSULE | ORAL | 0 refills | Status: DC

## 2022-06-23 NOTE — Telephone Encounter (Addendum)
TE was update in may 2024

## 2022-06-23 NOTE — Addendum Note (Signed)
Addended by: Gershon Crane A on: 06/23/2022 05:18 PM   Modules accepted: Orders

## 2022-06-23 NOTE — Telephone Encounter (Signed)
Pt mom is calling and she is aware rx can take up to 3 business day

## 2022-06-23 NOTE — Telephone Encounter (Signed)
Done

## 2022-06-23 NOTE — Telephone Encounter (Signed)
Please send to  [facility] #[id number] - [place], [place] - [address] AT [facility] [address] Phone: [phone]  Fax: [phone]

## 2022-06-23 NOTE — Addendum Note (Signed)
Addended by: Christy Sartorius on: 06/23/2022 02:48 PM   Modules accepted: Orders

## 2022-06-23 NOTE — Telephone Encounter (Signed)
Please see most recent encounter

## 2022-07-22 ENCOUNTER — Telehealth: Payer: Self-pay | Admitting: Family Medicine

## 2022-07-22 MED ORDER — METHYLPHENIDATE HCL ER (LA) 30 MG PO CP24: 30.0000 mg | ORAL_CAPSULE | ORAL | 0 refills | Status: DC

## 2022-07-22 MED ORDER — METHYLPHENIDATE HCL ER (LA) 30 MG PO CP24: ORAL_CAPSULE | ORAL | 0 refills | Status: DC

## 2022-07-22 NOTE — Addendum Note (Signed)
Addended by: Kristian Covey on: 07/22/2022 12:37 PM   Modules accepted: Orders

## 2022-07-22 NOTE — Telephone Encounter (Signed)
Prescription Request  07/22/2022  LOV: 03/23/2022  What is the name of the medication or equipment? methylphenidate (RITALIN LA) 30 MG 24 hr capsule   Have you contacted your pharmacy to request a refill? No   Which pharmacy would you like this sent to?   [facility] #[id number] - [place], [place] - [address] [place] [address] Phone: [phone] Fax: [phone]    Patient notified that their request is being sent to the clinical staff for review and that they should receive a response within 2 business days.   Please advise at Mobile [phone] (mobile)

## 2022-09-30 ENCOUNTER — Telehealth: Payer: Self-pay

## 2022-09-30 ENCOUNTER — Other Ambulatory Visit (HOSPITAL_COMMUNITY): Payer: Self-pay

## 2022-09-30 NOTE — Telephone Encounter (Signed)
*  Primary  Pharmacy Patient Advocate Encounter  Received notification from [facility] that Prior Authorization for Methylphenidate HCl ER (LA) 30MG  er capsules has been CANCELLED due to medicaid shows other primary insurance coverage. Patient may need to contact plan if this is not correct.

## 2022-10-05 NOTE — Telephone Encounter (Signed)
Patients mother informed of the message below. °

## 2022-10-27 ENCOUNTER — Telehealth: Payer: Self-pay | Admitting: Family Medicine

## 2022-10-27 MED ORDER — METHYLPHENIDATE HCL ER (LA) 30 MG PO CP24: ORAL_CAPSULE | ORAL | 0 refills | Status: DC

## 2022-10-27 MED ORDER — METHYLPHENIDATE HCL ER (LA) 30 MG PO CP24: 30.0000 mg | ORAL_CAPSULE | ORAL | 0 refills | Status: DC

## 2022-10-27 NOTE — Telephone Encounter (Signed)
Prescription Request  10/27/2022  LOV: 03/23/2022  What is the name of the medication or equipment?     methylphenidate (RITALIN LA) 30 MG 24 hr capsule  Have you contacted your pharmacy to request a refill? No   Which pharmacy would you like this sent to?  [facility] #[id number] - [place], [place] - [address] AT [address] Phone: [phone]  Fax: [phone]       Patient notified that their request is being sent to the clinical staff for review and that they should receive a response within 2 business days.   Please advise at Mobile [phone] (mobile)

## 2022-12-31 ENCOUNTER — Other Ambulatory Visit (HOSPITAL_COMMUNITY): Payer: Self-pay

## 2023-01-13 ENCOUNTER — Other Ambulatory Visit: Payer: Self-pay | Admitting: Family Medicine

## 2023-01-13 NOTE — Telephone Encounter (Signed)
Mother called to say Pt is completely out of methylphenidate (RITALIN LA) 30 MG 24 hr capsules.  Their preferred pharmacy does not currently have it in stock, but the pharmacy listed below does.   Mother is asking Rx be sent to the pharmacy below, just this one time.    [facility] #[id number] [place], [place] - [address] AT [facility] [address] [phone] [address] [place] [address]

## 2023-01-14 MED ORDER — METHYLPHENIDATE HCL ER (LA) 30 MG PO CP24: 30.0000 mg | ORAL_CAPSULE | ORAL | 0 refills | Status: DC

## 2023-01-14 MED ORDER — METHYLPHENIDATE HCL ER (LA) 30 MG PO CP24: ORAL_CAPSULE | ORAL | 0 refills | Status: DC

## 2023-01-19 ENCOUNTER — Other Ambulatory Visit (HOSPITAL_COMMUNITY): Payer: Self-pay

## 2023-01-20 ENCOUNTER — Other Ambulatory Visit (HOSPITAL_COMMUNITY): Payer: Self-pay

## 2023-06-21 ENCOUNTER — Other Ambulatory Visit: Payer: Self-pay | Admitting: Family Medicine

## 2023-06-21 MED ORDER — METHYLPHENIDATE HCL ER (LA) 30 MG PO CP24: 30.0000 mg | ORAL_CAPSULE | ORAL | 0 refills | Status: DC

## 2023-06-21 NOTE — Telephone Encounter (Signed)
 Last Fill: 01/14/23  Last OV: 03/23/22 Next OV: None Scheduled  Routing to provider for review/authorization.

## 2023-06-21 NOTE — Telephone Encounter (Signed)
 Refilled once only.  Not seen in over a year.  Needs office follow-up before further refills.  Marquetta Sit MD [facility]

## 2023-06-21 NOTE — Telephone Encounter (Signed)
 Copied from CRM [id number]. Topic: Clinical - Medication Refill >> Jun 21, 2023  9:49 AM [place] E wrote: Medication: methylphenidate  (RITALIN  LA) 30 MG 24 hr capsule  Has the patient contacted their pharmacy? Yes (Agent: If no, request that the patient contact the pharmacy for the refill. If patient does not wish to contact the pharmacy document the reason why and proceed with request.) (Agent: If yes, when and what did the pharmacy advise?)  This is the patient's preferred pharmacy:    [facility] #[id number] - [place], [place] - [address] AT [facility] [address]   [address] Phone: [phone] Fax: [phone]  Is this the correct pharmacy for this prescription? Yes If no, delete pharmacy and type the correct one.   Has the prescription been filled recently? No  Is the patient out of the medication? No, 3 days left.  Has the patient been seen for an appointment in the last year OR does the patient have an upcoming appointment? Yes  Can we respond through MyChart? Yes  Agent: Please be advised that Rx refills may take up to 3 business days. We ask that you follow-up with your pharmacy.

## 2023-06-22 NOTE — Telephone Encounter (Signed)
 Patients mother aware

## 2023-08-18 ENCOUNTER — Encounter: Payer: Self-pay | Admitting: Family Medicine

## 2023-08-18 ENCOUNTER — Ambulatory Visit: Admitting: Family Medicine

## 2023-08-18 VITALS — BP 124/80 | HR 105 | Temp 98.1°F | Wt 225.8 lb

## 2023-08-18 DIAGNOSIS — F909 Attention-deficit hyperactivity disorder, unspecified type: Secondary | ICD-10-CM | POA: Diagnosis not present

## 2023-08-18 MED ORDER — METHYLPHENIDATE HCL ER (LA) 30 MG PO CP24: ORAL_CAPSULE | ORAL | 0 refills | Status: AC

## 2023-08-18 MED ORDER — METHYLPHENIDATE HCL ER (LA) 30 MG PO CP24: 30.0000 mg | ORAL_CAPSULE | ORAL | 0 refills | Status: AC

## 2023-08-18 NOTE — Progress Notes (Signed)
 Established Patient Office Visit  Subjective   Patient ID: Joseph Pittman, male    DOB: [birth year]  Age: 38 y.o. MRN: [id number]  Chief Complaint  Patient presents with   Medication Consultation    HPI   Georgette is seen for medical follow-up.  He has history of ADD and takes Ritalin  LA 30 mg daily.  Has been on the current dose for several years.  He has history of traumatic brain injury several years ago.  Lives at home with mom.  Remote history of DVT lower extremity.  Gets out in walks most days.  No history of hypertension or diabetes though no recent lab work.  Generally feels well.  No specific complaints today.  No problems on medication.  He feels like current dosage is working well.  Weight is actually down a few pounds from last visit here in February 2024  Past Medical History:  Diagnosis Date   ADD 05/25/2008   ADD (attention deficit disorder with hyperactivity)    ADD (attention deficit disorder)    Brain injury ([facility]) 2003   Clotting disorder ([facility]) 2008   dvt   Personal history of traumatic brain injury 05/15/2009   Past Surgical History:  Procedure Laterality Date   BRAIN SURGERY  2003   tramatic injury   DVT     history of   FOOT SURGERY  2010   right foot    reports that he has been smoking. He has never used smokeless tobacco. He reports current alcohol use. He reports that he does not use drugs. family history includes Diabetes in his father and another family member; Heart disease in an other family member; Hyperlipidemia in his father. Allergies  Allergen Reactions   Sulfa Antibiotics     Review of Systems  Constitutional:  Negative for malaise/fatigue.  Eyes:  Negative for blurred vision.  Respiratory:  Negative for shortness of breath.   Cardiovascular:  Negative for chest pain.  Neurological:  Negative for dizziness, weakness and headaches.      Objective:     BP 124/80 (BP Location: Right Arm, Cuff Size: Normal)   Pulse (!) 105   Temp 98.1  F (36.7 C) (Oral)   Wt 225 lb 12.8 oz (102.4 kg)   SpO2 96%   BMI 35.37 kg/m  BP Readings from Last 3 Encounters:  08/18/23 124/80  03/23/22 110/60  03/12/22 (!) 150/92   Wt Readings from Last 3 Encounters:  08/18/23 225 lb 12.8 oz (102.4 kg)  03/23/22 228 lb 4.8 oz (103.6 kg)  07/25/21 216 lb 8 oz (98.2 kg)      Physical Exam Vitals reviewed.  Constitutional:      General: He is not in acute distress.    Appearance: He is not ill-appearing.  Cardiovascular:     Rate and Rhythm: Normal rate and regular rhythm.  Pulmonary:     Effort: Pulmonary effort is normal.     Breath sounds: Normal breath sounds.  Musculoskeletal:     Comments: Atrophy left lower extremity from prior injury.  Neurological:     Mental Status: He is alert.      No results found for any visits on 08/18/23.    The ASCVD Risk score (Arnett DK, et al., 2019) failed to calculate for the following reasons:   The 2019 ASCVD risk score is only valid for ages 59 to 10    Assessment & Plan:   Attention deficit disorder.  Patient on Ritalin  LA.  Stable  on current dosage.  Refill provided for 3 months.  Initial blood pressure up slightly but improved after rest. - Recommend continue regular exercise such as walking - We did recommend he consider setting up complete physical at some point this year and get some screening labs at that time  Wolm Scarlet, MD
